# Patient Record
Sex: Female | Born: 1960 | Race: Black or African American | Hispanic: No | Smoking: Never smoker
Health system: Southern US, Community
[De-identification: ages and names within clinical notes are randomized; demographics above are authoritative.]

## PROBLEM LIST (undated history)

## (undated) DIAGNOSIS — E785 Hyperlipidemia, unspecified: Secondary | ICD-10-CM

## (undated) DIAGNOSIS — D500 Iron deficiency anemia secondary to blood loss (chronic): Secondary | ICD-10-CM

## (undated) DIAGNOSIS — E119 Type 2 diabetes mellitus without complications: Secondary | ICD-10-CM

## (undated) DIAGNOSIS — B019 Varicella without complication: Secondary | ICD-10-CM

## (undated) DIAGNOSIS — K635 Polyp of colon: Secondary | ICD-10-CM

## (undated) DIAGNOSIS — I10 Essential (primary) hypertension: Secondary | ICD-10-CM

## (undated) HISTORY — DX: Iron deficiency anemia secondary to blood loss (chronic): D50.0

## (undated) HISTORY — DX: Type 2 diabetes mellitus without complications: E11.9

## (undated) HISTORY — DX: Hyperlipidemia, unspecified: E78.5

## (undated) HISTORY — DX: Varicella without complication: B01.9

## (undated) HISTORY — PX: COLONOSCOPY: SHX174

## (undated) HISTORY — DX: Polyp of colon: K63.5

## (undated) HISTORY — DX: Essential (primary) hypertension: I10

---

## 2006-11-13 HISTORY — PX: BUNIONECTOMY: SHX129

## 2009-11-13 HISTORY — PX: ROTATOR CUFF REPAIR: SHX139

## 2012-04-17 DIAGNOSIS — M25559 Pain in unspecified hip: Secondary | ICD-10-CM | POA: Insufficient documentation

## 2013-07-17 DIAGNOSIS — E1122 Type 2 diabetes mellitus with diabetic chronic kidney disease: Secondary | ICD-10-CM | POA: Insufficient documentation

## 2017-05-12 DIAGNOSIS — M5432 Sciatica, left side: Secondary | ICD-10-CM | POA: Insufficient documentation

## 2017-05-12 DIAGNOSIS — Z6841 Body Mass Index (BMI) 40.0 and over, adult: Secondary | ICD-10-CM

## 2017-09-06 DIAGNOSIS — R0600 Dyspnea, unspecified: Secondary | ICD-10-CM | POA: Insufficient documentation

## 2017-09-06 DIAGNOSIS — R0609 Other forms of dyspnea: Principal | ICD-10-CM

## 2017-10-08 DIAGNOSIS — Z6841 Body Mass Index (BMI) 40.0 and over, adult: Secondary | ICD-10-CM

## 2017-10-08 DIAGNOSIS — N183 Chronic kidney disease, stage 3 unspecified: Secondary | ICD-10-CM | POA: Insufficient documentation

## 2017-10-08 DIAGNOSIS — R7989 Other specified abnormal findings of blood chemistry: Secondary | ICD-10-CM | POA: Insufficient documentation

## 2017-10-29 ENCOUNTER — Other Ambulatory Visit (HOSPITAL_BASED_OUTPATIENT_CLINIC_OR_DEPARTMENT_OTHER): Payer: Self-pay | Admitting: Family Medicine

## 2017-10-29 DIAGNOSIS — Z1231 Encounter for screening mammogram for malignant neoplasm of breast: Secondary | ICD-10-CM

## 2017-10-30 ENCOUNTER — Encounter (HOSPITAL_BASED_OUTPATIENT_CLINIC_OR_DEPARTMENT_OTHER): Payer: Self-pay | Admitting: Radiology

## 2017-10-30 ENCOUNTER — Ambulatory Visit (HOSPITAL_BASED_OUTPATIENT_CLINIC_OR_DEPARTMENT_OTHER)
Admission: RE | Admit: 2017-10-30 | Discharge: 2017-10-30 | Disposition: A | Discharge status: Home / Self Care | Payer: BLUE CROSS/BLUE SHIELD | Source: Ambulatory Visit | Attending: Family Medicine | Admitting: Family Medicine

## 2017-10-30 DIAGNOSIS — Z1231 Encounter for screening mammogram for malignant neoplasm of breast: Secondary | ICD-10-CM | POA: Insufficient documentation

## 2017-11-30 ENCOUNTER — Ambulatory Visit: Payer: Self-pay | Admitting: Family

## 2017-12-12 ENCOUNTER — Ambulatory Visit: Payer: Self-pay | Admitting: Obstetrics & Gynecology

## 2018-01-04 ENCOUNTER — Ambulatory Visit: Payer: BLUE CROSS/BLUE SHIELD | Admitting: Family

## 2018-01-04 ENCOUNTER — Encounter: Payer: Self-pay | Admitting: Family

## 2018-01-04 DIAGNOSIS — N289 Disorder of kidney and ureter, unspecified: Secondary | ICD-10-CM | POA: Diagnosis not present

## 2018-01-04 DIAGNOSIS — E118 Type 2 diabetes mellitus with unspecified complications: Secondary | ICD-10-CM

## 2018-01-04 DIAGNOSIS — I10 Essential (primary) hypertension: Secondary | ICD-10-CM | POA: Diagnosis not present

## 2018-01-04 DIAGNOSIS — E559 Vitamin D deficiency, unspecified: Secondary | ICD-10-CM | POA: Diagnosis not present

## 2018-01-04 DIAGNOSIS — E1165 Type 2 diabetes mellitus with hyperglycemia: Secondary | ICD-10-CM

## 2018-01-04 DIAGNOSIS — E785 Hyperlipidemia, unspecified: Secondary | ICD-10-CM | POA: Insufficient documentation

## 2018-01-04 NOTE — Patient Instructions (Signed)
Please complete lab work prior to leaving.  Welcome to [place]! 

## 2018-01-04 NOTE — Progress Notes (Signed)
Subjective:    Patient ID: Janet Rose, female    DOB: 03/26/1961, 57 y.o.   MRN: [id number]  HPI  Patient is a 57 yr old female who presents today to establish care.   DM2- reports that she was diagnosed with "pre-diabetes" 10 yrs ago. Diagnosed with DM2 5-6 years ago. She was previously followed at [facility]. She is working on her healthy diet and exercise.  She takes metformin bid. Reports good med compliance. Reports that aspirin caused her gi upset so she stopped. Also has stomach upset from statin.  She stopped.    Vit D deficiency- reports that she was recently diagnosed. Level was 11 back in January.   Reports that she saw nephrology due to "kidney test" and was told "to drink more water." Review in care everywhere and Cr was 1.6.    HTN- maintained on hyzaar 50-12.5.  BP Readings from Last 3 Encounters:  01/04/18 123/73   Hyperlipidemia- was on lipitor but stopped due to GI upset.     Review of Systems  Constitutional: Negative for unexpected weight change.  HENT: Negative for rhinorrhea.   Eyes: Negative for visual disturbance.  Respiratory: Negative for cough.   Cardiovascular: Negative for leg swelling.  Gastrointestinal: Negative for diarrhea, nausea and vomiting.  Genitourinary: Negative for dysuria and frequency.  Musculoskeletal: Negative for arthralgias and myalgias.  Skin: Negative for rash.  Neurological: Negative for headaches.  Hematological: Negative for adenopathy.  Psychiatric/Behavioral:       Denies depression/anxiety   Past Medical History:  Diagnosis Date  . Chicken pox   . Colon polyps   . Diabetes mellitus without complication (HCC)   . Hyperlipidemia   . Hypertension      Social History   Socioeconomic History  . Marital status: Divorced    Spouse name: Not on file  . Number of children: Not on file  . Years of education: Not on file  . Highest education level: Not on file  Social Needs  . Financial resource strain: Not on file   . Food insecurity - worry: Not on file  . Food insecurity - inability: Not on file  . Transportation needs - medical: Not on file  . Transportation needs - non-medical: Not on file  Occupational History  . Not on file  Tobacco Use  . Smoking status: Never Smoker  . Smokeless tobacco: Never Used  Substance and Sexual Activity  . Alcohol use: Yes    Frequency: Never    Comment: occasionally  . Drug use: No  . Sexual activity: Not on file  Other Topics Concern  . Not on file  Social History Narrative  . Not on file      Family History  Problem Relation Age of Onset  . Arthritis Mother   . Heart disease Mother   . Hyperlipidemia Mother   . Hypertension Mother   . Kidney disease Mother   . Stroke Mother   . Heart attack Mother   . Depression Sister   . Drug abuse Sister   . Early death Sister        ?accidental overdose  . Cancer Brother        prostate  . Diabetes Brother   . Asthma Son   . Learning disabilities Son   . Mental illness Son   . Asthma Sister   . Diabetes Sister   . Hypertension Sister   . Drug abuse Brother   . Diabetes Brother   . Heart attack  Brother   . Learning disabilities Brother   . Drug abuse Brother     Allergies  Allergen Reactions  . Lisinopril Other (See Comments)    Other reaction(s): COUGH Other reaction(s): COUGH     Current Outpatient Medications on File Prior to Visit  Medication Sig Dispense Refill  . losartan-hydrochlorothiazide (HYZAAR) 50-12.5 MG tablet Take by mouth.    . metFORMIN (GLUCOPHAGE) 500 MG tablet TK 2 TS PO BID  1  . Vitamin D, Ergocalciferol, (DRISDOL) 50000 units CAPS capsule TK 1 C PO 1 TIME A WK  5   No current facility-administered medications on file prior to visit.     BP 123/73 (BP Location: Right Arm, Patient Position: Sitting, Cuff Size: Large)   Pulse 86   Temp 98.3 F (36.8 C) (Oral)   Resp 16   Ht 4\' 11"  (1.499 m)   Wt 242 lb 6.4 oz (110 kg)   SpO2 100%   BMI 48.96 kg/m          Objective:   Physical Exam  Constitutional: She appears well-developed and well-nourished.  HENT:  Head: Normocephalic and atraumatic.  Eyes: No scleral icterus.  Cardiovascular: Normal rate, regular rhythm and normal heart sounds.  No murmur heard. Pulmonary/Chest: Effort normal and breath sounds normal. No respiratory distress. She has no wheezes.  Musculoskeletal: She exhibits no edema.  Lymphadenopathy:    She has no cervical adenopathy.  Neurological: She is alert.  Skin: Skin is warm and dry.  Psychiatric: She has a normal mood and affect. Her behavior is normal. Judgment and thought content normal.          Assessment & Plan:

## 2018-01-05 LAB — BASIC METABOLIC PANEL
BUN/Creatinine Ratio: 18 (calc) (ref 6–22)
BUN: 27 mg/dL — AB (ref 7–25)
CALCIUM: 9.5 mg/dL (ref 8.6–10.4)
CO2: 26 mmol/L (ref 20–32)
CREATININE: 1.52 mg/dL — AB (ref 0.50–1.05)
Chloride: 105 mmol/L (ref 98–110)
GLUCOSE: 147 mg/dL — AB (ref 65–99)
Potassium: 4.4 mmol/L (ref 3.5–5.3)
Sodium: 140 mmol/L (ref 135–146)

## 2018-01-05 LAB — HEPATIC FUNCTION PANEL
AG RATIO: 1.1 (calc) (ref 1.0–2.5)
ALT: 13 U/L (ref 6–29)
AST: 16 U/L (ref 10–35)
Albumin: 3.9 g/dL (ref 3.6–5.1)
Alkaline phosphatase (APISO): 86 U/L (ref 33–130)
BILIRUBIN DIRECT: 0.1 mg/dL (ref 0.0–0.2)
BILIRUBIN TOTAL: 0.3 mg/dL (ref 0.2–1.2)
Globulin: 3.6 g/dL (calc) (ref 1.9–3.7)
Indirect Bilirubin: 0.2 mg/dL (calc) (ref 0.2–1.2)
Total Protein: 7.5 g/dL (ref 6.1–8.1)

## 2018-01-05 LAB — LIPID PANEL
CHOL/HDL RATIO: 3.7 (calc) (ref <5.0)
CHOLESTEROL: 125 mg/dL (ref <200)
HDL: 34 mg/dL — AB (ref >50)
LDL Cholesterol (Calc): 73 mg/dL (calc)
Non-HDL Cholesterol (Calc): 91 mg/dL (calc) (ref <130)
TRIGLYCERIDES: 95 mg/dL (ref <150)

## 2018-01-05 LAB — HEMOGLOBIN A1C
EAG (MMOL/L): 11.7 (calc)
Hgb A1c MFr Bld: 9 % of total Hgb — ABNORMAL HIGH (ref <5.7)
Mean Plasma Glucose: 212 (calc)

## 2018-01-06 ENCOUNTER — Other Ambulatory Visit: Payer: Self-pay | Admitting: Family

## 2018-01-06 NOTE — Telephone Encounter (Signed)
Sugar is above goal. Add januvia once daily.  Please ask her what pharmacy she uses.  Also, cholesterol looks ok but we really should have her on statin to protect her heart.  I will send an rx for a different statin. Please start and call me if she has GI upset .

## 2018-01-07 DIAGNOSIS — N289 Disorder of kidney and ureter, unspecified: Secondary | ICD-10-CM | POA: Insufficient documentation

## 2018-01-07 DIAGNOSIS — IMO0002 Reserved for concepts with insufficient information to code with codable children: Secondary | ICD-10-CM | POA: Insufficient documentation

## 2018-01-07 DIAGNOSIS — E559 Vitamin D deficiency, unspecified: Secondary | ICD-10-CM | POA: Insufficient documentation

## 2018-01-07 DIAGNOSIS — E1165 Type 2 diabetes mellitus with hyperglycemia: Secondary | ICD-10-CM | POA: Insufficient documentation

## 2018-01-07 NOTE — Assessment & Plan Note (Addendum)
Lab Results  Component Value Date   HGBA1C 9.0 (H) 01/04/2018   Add januvia 50mg  (see phone note).  Glipizide 2.5 mg and d/c metformin.

## 2018-01-07 NOTE — Assessment & Plan Note (Signed)
BP is stable on current meds, continue same.  

## 2018-01-07 NOTE — Assessment & Plan Note (Signed)
Lab Results  Component Value Date   CREATININE 1.52 (H) 01/04/2018   Will d/c metformin, see phone note.

## 2018-01-07 NOTE — Assessment & Plan Note (Signed)
Continue vit D supplement, plan to check level at next visit.

## 2018-01-07 NOTE — Telephone Encounter (Signed)
Also, her kidney function is mildly reduced. She should stop metformin.  Add glipizide 2.5mg  once daily along with [place] 50mg .  Work on diet.

## 2018-01-07 NOTE — Assessment & Plan Note (Addendum)
Lab Results  Component Value Date   CHOL 125 01/04/2018   HDL 34 (L) 01/04/2018   TRIG 95 01/04/2018   CHOLHDL 3.7 01/04/2018   Will give trial of low dose simvastatin to see if she can better tolerate from GI standpoint and to help reduce CV risk.

## 2018-01-08 LAB — MICROALBUMIN / CREATININE URINE RATIO
CREATININE, URINE: 85 mg/dL (ref 20–275)
MICROALB/CREAT RATIO: 4 ug/mg{creat} (ref <30)
Microalb, Ur: 0.3 mg/dL

## 2018-01-08 MED ORDER — GLIPIZIDE ER 2.5 MG PO TB24
2.5000 mg | ORAL_TABLET | Freq: Every day | ORAL | 5 refills | Status: AC
Start: 2018-01-08 — End: ?

## 2018-01-08 MED ORDER — SIMVASTATIN 20 MG PO TABS: 20.0000 mg | ORAL_TABLET | Freq: Every day | ORAL | 3 refills | Status: DC

## 2018-01-08 MED ORDER — SITAGLIPTIN PHOSPHATE 50 MG PO TABS: 50.0000 mg | ORAL_TABLET | Freq: Every day | ORAL | 3 refills | Status: DC

## 2018-01-08 MED ORDER — FREESTYLE LANCETS MISC: 1 refills | Status: DC

## 2018-01-08 NOTE — Telephone Encounter (Signed)
Janet Rose-- I attempted to send glipizide 2.5mg  but it was only available in that strength in the ER / XL form. Is that ok?  Notified pt and she voices understanding. She is agreeable to try the statin. Rxs sent. She also states that pharmacy is needing an Rx for generic lancets. She took handwritten Rx to pharmacy and they have not given her a meter yet but she thinks it may be a freestyle. Rx sent.

## 2018-02-19 ENCOUNTER — Other Ambulatory Visit: Payer: Self-pay | Admitting: Family

## 2018-04-05 ENCOUNTER — Telehealth: Payer: Self-pay | Admitting: Family

## 2018-04-05 ENCOUNTER — Ambulatory Visit (INDEPENDENT_AMBULATORY_CARE_PROVIDER_SITE_OTHER): Payer: BLUE CROSS/BLUE SHIELD | Admitting: Family

## 2018-04-05 ENCOUNTER — Encounter: Payer: Self-pay | Admitting: Family

## 2018-04-05 VITALS — BP 108/50 | HR 88 | Temp 98.5°F | Resp 16 | Ht 59.5 in | Wt 241.4 lb

## 2018-04-05 DIAGNOSIS — Z0000 Encounter for general adult medical examination without abnormal findings: Secondary | ICD-10-CM | POA: Diagnosis not present

## 2018-04-05 DIAGNOSIS — E785 Hyperlipidemia, unspecified: Secondary | ICD-10-CM | POA: Diagnosis not present

## 2018-04-05 DIAGNOSIS — E1165 Type 2 diabetes mellitus with hyperglycemia: Secondary | ICD-10-CM

## 2018-04-05 DIAGNOSIS — D649 Anemia, unspecified: Secondary | ICD-10-CM

## 2018-04-05 DIAGNOSIS — E559 Vitamin D deficiency, unspecified: Secondary | ICD-10-CM | POA: Diagnosis not present

## 2018-04-05 DIAGNOSIS — R9431 Abnormal electrocardiogram [ECG] [EKG]: Secondary | ICD-10-CM

## 2018-04-05 DIAGNOSIS — I10 Essential (primary) hypertension: Secondary | ICD-10-CM | POA: Diagnosis not present

## 2018-04-05 NOTE — Telephone Encounter (Signed)
Please let pt know that I reviewed her EKG. It is mildly abnormal. I would like her to meet with cardiology for further evaluation. Go to the ER in the event of chest pain.

## 2018-04-05 NOTE — Progress Notes (Signed)
Subjective:    Patient ID: Janet Rose, female    DOB: 03/01/1961, 57 y.o.   MRN: [id number]  HPI  Patient presents today for complete physical.  Immunizations: Tetanus performed 12/23/2010, she is due for a follow-up Pneumovax booster today. Diet: reports that she is following a vegetarian diet Exercise: zumba 2 times a week Colonoscopy: Last colonoscopy performed 08/13/2017 Wt Readings from Last 3 Encounters:  04/05/18 241 lb 6.4 oz (109.5 kg)  01/04/18 242 lb 6.4 oz (110 kg)  Dexa: due Pap Smear:due sees GYN Mammogram: Performed 10/30/2017 Vision:  Due- will schedule Dental:  Due, she is getting a new dental insurance   Diabetes type 2- she is maintained on Glucotrol and [place].  Not checking sugars every day.  Reports that she sees sugars high of 190. Denies hypoglycemia.  Lab Results  Component Value Date   HGBA1C 9.0 (H) 01/04/2018   Lab Results  Component Value Date   MICROALBUR 0.3 01/04/2018   LDLCALC 73 01/04/2018   CREATININE 1.52 (H) 01/04/2018   Chronic kidney disease stage III-  Followed by Dr. Janit Pagan, nephrology.   Hyperlipidemia- Last visit we gave her a trial of simvastatin. Denies side effect.  Lab Results  Component Value Date   CHOL 125 01/04/2018   HDL 34 (L) 01/04/2018   LDLCALC 73 01/04/2018   TRIG 95 01/04/2018   CHOLHDL 3.7 01/04/2018   Vit D deficien she is maintained on vitamin D. Review of Systems  Constitutional: Negative for unexpected weight change.  HENT: Negative for hearing loss and rhinorrhea.   Eyes: Negative for visual disturbance.  Respiratory:       Mild allergy cough  Cardiovascular: Negative for leg swelling.  Gastrointestinal: Negative for constipation and diarrhea.  Genitourinary: Negative for dysuria and frequency.  Musculoskeletal: Negative for arthralgias and myalgias.  Neurological: Negative for headaches.  Hematological: Negative for adenopathy.  Psychiatric/Behavioral:       Denies depression/anxiety   Past  Medical History:  Diagnosis Date  . Chicken pox   . Colon polyps   . Diabetes mellitus without complication (HCC)   . Hyperlipidemia   . Hypertension      Social History   Socioeconomic History  . Marital status: Divorced    Spouse name: Not on file  . Number of children: Not on file  . Years of education: Not on file  . Highest education level: Not on file  Occupational History  . Not on file  Social Needs  . Financial resource strain: Not on file  . Food insecurity:    Worry: Not on file    Inability: Not on file  . Transportation needs:    Medical: Not on file    Non-medical: Not on file  Tobacco Use  . Smoking status: Never Smoker  . Smokeless tobacco: Never Used  Substance and Sexual Activity  . Alcohol use: Yes    Frequency: Never    Comment: occasionally  . Drug use: No  . Sexual activity: Not on file  Lifestyle  . Physical activity:    Days per week: Not on file    Minutes per session: Not on file  . Stress: Not on file  Relationships  . Social connections:    Talks on phone: Not on file    Gets together: Not on file    Attends religious service: Not on file    Active member of club or organization: Not on file    Attends meetings of clubs or organizations: Not  on file    Relationship status: Not on file  . Intimate partner violence:    Fear of current or ex partner: Not on file    Emotionally abused: Not on file    Physically abused: Not on file    Forced sexual activity: Not on file  Other Topics Concern  . Not on file  Social History Narrative  . Not on file      Family History  Problem Relation Age of Onset  . Arthritis Mother   . Heart disease Mother   . Hyperlipidemia Mother   . Hypertension Mother   . Kidney disease Mother   . Stroke Mother   . Heart attack Mother   . Depression Sister   . Drug abuse Sister   . Early death Sister        ?accidental overdose  . Cancer Brother        prostate  . Diabetes Brother   . Asthma Son    . Learning disabilities Son   . Mental illness Son   . Asthma Sister   . Diabetes Sister   . Hypertension Sister   . Drug abuse Brother   . Diabetes Brother   . Heart attack Brother   . Learning disabilities Brother   . Drug abuse Brother     Allergies  Allergen Reactions  . Lisinopril Other (See Comments)    Other reaction(s): COUGH Other reaction(s): COUGH     Current Outpatient Medications on File Prior to Visit  Medication Sig Dispense Refill  . glipiZIDE (GLUCOTROL XL) 2.5 MG 24 hr tablet Take 1 tablet (2.5 mg total) by mouth daily with breakfast. 30 tablet 5  . Lancets (FREESTYLE) lancets Use as instructed to check blood sugar once a day.  DX E11.65 100 each 1  . losartan-hydrochlorothiazide (HYZAAR) 50-12.5 MG tablet TAKE 1 TABLET BY MOUTH DAILY 90 tablet 0  . simvastatin (ZOCOR) 20 MG tablet Take 1 tablet (20 mg total) by mouth at bedtime. 30 tablet 3  . sitaGLIPtin (JANUVIA) 50 MG tablet Take 1 tablet (50 mg total) by mouth daily. 30 tablet 3  . Vitamin D, Ergocalciferol, (DRISDOL) 50000 units CAPS capsule TK 1 C PO 1 TIME A WK  5   No current facility-administered medications on file prior to visit.     BP (!) 108/50 (BP Location: Right Arm, Patient Position: Sitting, Cuff Size: Large)   Pulse 88   Temp 98.5 F (36.9 C) (Oral)   Resp 16   Ht 4' 11.5" (1.511 m)   Wt 241 lb 6.4 oz (109.5 kg)   SpO2 100%   BMI 47.94 kg/m       Objective:   Physical Exam Physical Exam  Constitutional: She is oriented to person, place, and time. She appears well-developed and well-nourished. No distress.  HENT:  Head: Normocephalic and atraumatic.  Right Ear: Tympanic membrane and ear canal normal.  Left Ear: Tympanic membrane and ear canal normal.  Mouth/Throat: Oropharynx is clear and moist.  Eyes: Pupils are equal, round, and reactive to light. No scleral icterus.  Neck: Normal range of motion. No thyromegaly present.  Cardiovascular: Normal rate and regular rhythm.    No murmur heard. Pulmonary/Chest: Effort normal and breath sounds normal. No respiratory distress. He has no wheezes. She has no rales. She exhibits no tenderness.  Abdominal: Soft. Bowel sounds are normal. She exhibits no distension and no mass. There is no tenderness. There is no rebound and no guarding.  Musculoskeletal: She  exhibits no edema.  Lymphadenopathy:    She has no cervical adenopathy.  Neurological: She is alert and oriented to person, place, and time. She has normal patellar reflexes. She exhibits normal muscle tone. Coordination normal.  Skin: Skin is warm and dry.  Psychiatric: She has a normal mood and affect. Her behavior is normal. Judgment and thought content normal.  Breast/pelvic: deferred       Assessment & Plan:   Preventative- discussed diet/exercise/weight loss.  Obtain routine lab work. mammo up to date, declines pneumovax booster.  DM2- check follow up A1C.  Hyperlipidemia- check follow up lipid panel.   Vit D deficiency- check follow up vit D.  HTN- bp stable on current meds.   Abnormal EKG- EKG is personally reviewed. No EKG available for comparison. She does have some inverted T waves in leads I, III and V1. Will refer to cardiology for further evaluation.     Assessment & Plan:

## 2018-04-05 NOTE — Patient Instructions (Addendum)
Please complete lab work prior to leaving. Continue to work on [employer], exercise, weight loss.  Schedule a bone density on the first floor.

## 2018-04-06 ENCOUNTER — Telehealth: Payer: Self-pay | Admitting: Family

## 2018-04-06 MED ORDER — PIOGLITAZONE HCL 30 MG PO TABS: 30.0000 mg | ORAL_TABLET | Freq: Every day | ORAL | 2 refills | Status: DC

## 2018-04-06 MED ORDER — SIMVASTATIN 40 MG PO TABS: 40.0000 mg | ORAL_TABLET | Freq: Every day | ORAL | 5 refills | Status: DC

## 2018-04-06 NOTE — Telephone Encounter (Signed)
Also, cholesterol remains above goal. Increase simvastatin from  to  once daily.  Blood count is low.  Ask lab to add on serum iron, ferritin, b12 and folate.  Complete IFOB, dx anemia.  A1C control has worsened. Add actos.  Work on diet/exercise/weight loss. Check sugar once daily for 1 week after starting actos and call readings in to me. Follow up in 3 months.

## 2018-04-09 ENCOUNTER — Other Ambulatory Visit (HOSPITAL_BASED_OUTPATIENT_CLINIC_OR_DEPARTMENT_OTHER): Payer: BLUE CROSS/BLUE SHIELD

## 2018-04-09 LAB — IRON,TIBC AND FERRITIN PANEL
%SAT: 15 % (ref 11–50)
FERRITIN: 63 ng/mL (ref 10–232)
IRON: 50 ug/dL (ref 45–160)
TIBC: 325 mcg/dL (calc) (ref 250–450)

## 2018-04-09 LAB — BASIC METABOLIC PANEL
BUN/Creatinine Ratio: 16 (calc) (ref 6–22)
BUN: 27 mg/dL — ABNORMAL HIGH (ref 7–25)
CALCIUM: 9.6 mg/dL (ref 8.6–10.4)
CO2: 24 mmol/L (ref 20–32)
Chloride: 102 mmol/L (ref 98–110)
Creat: 1.67 mg/dL — ABNORMAL HIGH (ref 0.50–1.05)
GLUCOSE: 219 mg/dL — AB (ref 65–99)
Potassium: 4.4 mmol/L (ref 3.5–5.3)
Sodium: 136 mmol/L (ref 135–146)

## 2018-04-09 LAB — TEST AUTHORIZATION

## 2018-04-09 LAB — LIPID PANEL
Cholesterol: 192 mg/dL (ref <200)
HDL: 35 mg/dL — ABNORMAL LOW (ref >50)
LDL CHOLESTEROL (CALC): 136 mg/dL — AB
NON-HDL CHOLESTEROL (CALC): 157 mg/dL — AB (ref <130)
Total CHOL/HDL Ratio: 5.5 (calc) — ABNORMAL HIGH (ref <5.0)
Triglycerides: 106 mg/dL (ref <150)

## 2018-04-09 LAB — URINALYSIS, ROUTINE W REFLEX MICROSCOPIC
Bilirubin Urine: NEGATIVE
Glucose, UA: NEGATIVE
Hgb urine dipstick: NEGATIVE
Ketones, ur: NEGATIVE
LEUKOCYTES UA: NEGATIVE
Nitrite: NEGATIVE
PROTEIN: NEGATIVE
SPECIFIC GRAVITY, URINE: 1.014 (ref 1.001–1.03)
pH: <=5 (ref 5.0–8.0)

## 2018-04-09 LAB — CBC WITH DIFFERENTIAL/PLATELET
BASOS ABS: 32 {cells}/uL (ref 0–200)
Basophils Relative: 0.4 %
EOS ABS: 168 {cells}/uL (ref 15–500)
Eosinophils Relative: 2.1 %
HCT: 29.6 % — ABNORMAL LOW (ref 35.0–45.0)
HEMOGLOBIN: 10 g/dL — AB (ref 11.7–15.5)
Lymphs Abs: 3608 cells/uL (ref 850–3900)
MCH: 27.9 pg (ref 27.0–33.0)
MCHC: 33.8 g/dL (ref 32.0–36.0)
MCV: 82.7 fL (ref 80.0–100.0)
MONOS PCT: 6.3 %
MPV: 11.6 fL (ref 7.5–12.5)
NEUTROS PCT: 46.1 %
Neutro Abs: 3688 cells/uL (ref 1500–7800)
PLATELETS: 268 10*3/uL (ref 140–400)
RBC: 3.58 10*6/uL — ABNORMAL LOW (ref 3.80–5.10)
RDW: 12.5 % (ref 11.0–15.0)
TOTAL LYMPHOCYTE: 45.1 %
WBC mixed population: 504 cells/uL (ref 200–950)
WBC: 8 10*3/uL (ref 3.8–10.8)

## 2018-04-09 LAB — TSH: TSH: 0.92 m[IU]/L (ref 0.40–4.50)

## 2018-04-09 LAB — VITAMIN D 1,25 DIHYDROXY
VITAMIN D 1, 25 (OH) TOTAL: 41 pg/mL (ref 18–72)
VITAMIN D2 1, 25 (OH): 29 pg/mL
VITAMIN D3 1, 25 (OH): 12 pg/mL

## 2018-04-09 LAB — HEPATIC FUNCTION PANEL
AG RATIO: 1.2 (calc) (ref 1.0–2.5)
ALKALINE PHOSPHATASE (APISO): 78 U/L (ref 33–130)
ALT: 14 U/L (ref 6–29)
AST: 20 U/L (ref 10–35)
Albumin: 4 g/dL (ref 3.6–5.1)
BILIRUBIN INDIRECT: 0.4 mg/dL (ref 0.2–1.2)
BILIRUBIN TOTAL: 0.5 mg/dL (ref 0.2–1.2)
Bilirubin, Direct: 0.1 mg/dL (ref 0.0–0.2)
Globulin: 3.4 g/dL (calc) (ref 1.9–3.7)
Total Protein: 7.4 g/dL (ref 6.1–8.1)

## 2018-04-09 LAB — HEMOGLOBIN A1C
HEMOGLOBIN A1C: 10.9 %{Hb} — AB (ref <5.7)
Mean Plasma Glucose: 266 (calc)
eAG (mmol/L): 14.7 (calc)

## 2018-04-09 LAB — B12 AND FOLATE PANEL
Folate: 11.8 ng/mL
Vitamin B-12: 510 pg/mL (ref 200–1100)

## 2018-04-09 NOTE — Telephone Encounter (Signed)
Returning call.

## 2018-04-09 NOTE — Telephone Encounter (Signed)
Notified pt of below results. She will pick up IFOB tonight. Kit placed at front desk and order entered. Below labs were added on per [facility] this morning. 3 month follow up scheduled for 07/10/18 at 12:40pm.

## 2018-04-09 NOTE — Telephone Encounter (Signed)
Labs added per [place] at [facility]. Left message for pt to return my call. Please notify pt of below when she returns call and ask her if she wants IFOB kit mailed to her or does she want to pick it up at the office?

## 2018-04-10 NOTE — Telephone Encounter (Signed)
Opened in error

## 2018-04-16 ENCOUNTER — Ambulatory Visit: Payer: BLUE CROSS/BLUE SHIELD | Admitting: Cardiology

## 2018-04-16 ENCOUNTER — Encounter: Payer: Self-pay | Admitting: Cardiology

## 2018-04-16 VITALS — BP 132/78 | HR 86 | Ht 59.5 in | Wt 243.0 lb

## 2018-04-16 DIAGNOSIS — I10 Essential (primary) hypertension: Secondary | ICD-10-CM

## 2018-04-16 DIAGNOSIS — N289 Disorder of kidney and ureter, unspecified: Secondary | ICD-10-CM | POA: Diagnosis not present

## 2018-04-16 DIAGNOSIS — N183 Chronic kidney disease, stage 3 unspecified: Secondary | ICD-10-CM

## 2018-04-16 DIAGNOSIS — R0609 Other forms of dyspnea: Secondary | ICD-10-CM | POA: Diagnosis not present

## 2018-04-16 DIAGNOSIS — R0602 Shortness of breath: Secondary | ICD-10-CM | POA: Diagnosis not present

## 2018-04-16 DIAGNOSIS — Z6841 Body Mass Index (BMI) 40.0 and over, adult: Secondary | ICD-10-CM | POA: Diagnosis not present

## 2018-04-16 DIAGNOSIS — E782 Mixed hyperlipidemia: Secondary | ICD-10-CM | POA: Diagnosis not present

## 2018-04-16 DIAGNOSIS — R0600 Dyspnea, unspecified: Secondary | ICD-10-CM

## 2018-04-16 DIAGNOSIS — E66813 Obesity, class 3: Secondary | ICD-10-CM

## 2018-04-16 MED ORDER — ASPIRIN EC 81 MG PO TBEC: 81.0000 mg | DELAYED_RELEASE_TABLET | Freq: Every day | ORAL | 3 refills | Status: DC

## 2018-04-16 NOTE — Progress Notes (Signed)
Cardiology Office Note:    Date:  04/16/2018   ID:  Janet Rose, DOB July 11, 1961, MRN [id number]  PCP:  Sandford Craze, NP  Cardiologist:  Garwin Brothers, MD   Referring MD: Sandford Craze, NP    ASSESSMENT:    1. DOE (dyspnea on exertion)   2. Essential hypertension   3. Renal insufficiency   4. CKD (chronic kidney disease) stage 3, GFR 30-59 ml/min (HCC)   5. Mixed hyperlipidemia   6. Class 3 severe obesity due to excess calories with serious comorbidity and body mass index (BMI) of 45.0 to 49.9 in adult (HCC)   7. Morbid obesity with BMI of 45.0-49.9, adult (HCC)   8. Shortness of breath    PLAN:    In order of problems listed above:  1. Primary prevention stressed to the patient.  Importance of compliance with diet and medications stressed and she vocalized understanding.  Her blood pressure is stable.  Diet was discussed for dyslipidemia diabetes mellitus and obesity and the risks of obesity explained and she vocalized understanding. 2. Echocardiogram will be done to assess murmur heard on auscultation.  I would like to assess her shortness of breath symptoms and make sure that this does not have a coronary etiology.  She will undergo exercise stress Cardiolite. 3. I told her to take aspirin if it is okay with her primary care physician.  She is being evaluated for anemia.  I recommended aspirin if there are no contraindications in view of her diabetes mellitus. 4. Patient will be seen in follow-up appointment in 3 months or earlier if the patient has any concerns    Medication Adjustments/Labs and Tests Ordered: Current medicines are reviewed at length with the patient today.  Concerns regarding medicines are outlined above.  Orders Placed This Encounter  Procedures  . MYOCARDIAL PERFUSION IMAGING  . ECHOCARDIOGRAM COMPLETE   Meds ordered this encounter  Medications  . aspirin EC 81 MG tablet    Sig: Take 1 tablet (81 mg total) by mouth daily.    Dispense:   90 tablet    Refill:  3     History of Present Illness:    Janet Rose is a 57 y.o. female who is being seen today for the evaluation of shortness of breath on exertion at the request of Sandford Craze, NP.  Patient is a pleasant 57 year old female.  She has past medical history of essential hypertension, renal insufficiency, dyslipidemia and morbid obesity.  She is referred here for shortness of breath on exertion.  She mentions to me that she has no chest pain orthopnea or PND and when she starts exerting herself she gets out of breath.  This is been happening for the past several weeks.  No syncope.  She is active lady but does not exercise on a regular basis.  At the time of my evaluation, the patient is alert awake oriented and in no distress.  Past Medical History:  Diagnosis Date  . Chicken pox   . Colon polyps   . Diabetes mellitus without complication (HCC)   . Hyperlipidemia   . Hypertension     Past Surgical History:  Procedure Laterality Date  . CESAREAN SECTION  1991  . CESAREAN SECTION  1994    Current Medications: Current Meds  Medication Sig  . glipiZIDE (GLUCOTROL XL) 2.5 MG 24 hr tablet Take 1 tablet (2.5 mg total) by mouth daily with breakfast.  . Lancets (FREESTYLE) lancets Use as instructed to check blood sugar  once a day.  DX E11.65  . losartan-hydrochlorothiazide (HYZAAR) 50-12.5 MG tablet TAKE 1 TABLET BY MOUTH DAILY  . simvastatin (ZOCOR) 40 MG tablet Take 1 tablet (40 mg total) by mouth at bedtime.  . sitaGLIPtin (JANUVIA) 50 MG tablet Take 1 tablet (50 mg total) by mouth daily.  . Vitamin D, Ergocalciferol, (DRISDOL) 50000 units CAPS capsule TK 1 C PO 1 TIME A WK     Allergies:   Lisinopril   Social History   Socioeconomic History  . Marital status: Divorced    Spouse name: Not on file  . Number of children: Not on file  . Years of education: Not on file  . Highest education level: Not on file  Occupational History  . Not on file    Social Needs  . Financial resource strain: Not on file  . Food insecurity:    Worry: Not on file    Inability: Not on file  . Transportation needs:    Medical: Not on file    Non-medical: Not on file  Tobacco Use  . Smoking status: Never Smoker  . Smokeless tobacco: Never Used  Substance and Sexual Activity  . Alcohol use: Yes    Frequency: Never    Comment: occasionally  . Drug use: No  . Sexual activity: Not on file  Lifestyle  . Physical activity:    Days per week: Not on file    Minutes per session: Not on file  . Stress: Not on file  Relationships  . Social connections:    Talks on phone: Not on file    Gets together: Not on file    Attends religious service: Not on file    Active member of club or organization: Not on file    Attends meetings of clubs or organizations: Not on file    Relationship status: Not on file  Other Topics Concern  . Not on file  Social History Narrative   Lives with daughter   Works as an Secretary/administrator (makes cakes)   No pets   1 [place]     Family History: The patient's family history includes Arthritis in her mother; Asthma in her sister and son; Cancer in her brother; Depression in her sister; Diabetes in her brother, brother, and sister; Drug abuse in her brother, brother, and sister; Early death in her sister; Heart attack in her brother and mother; Heart disease in her mother; Hyperlipidemia in her mother; Hypertension in her mother and sister; Kidney disease in her mother; Learning disabilities in her brother and son; Mental illness in her son; Stroke in her mother.  ROS:   Please see the history of present illness.    All other systems reviewed and are negative.  EKGs/Labs/Other Studies Reviewed:    The following studies were reviewed today: I discussed my findings with the patient at extensive length.  I reviewed records her hemoglobin A1c is markedly elevated.   Recent Labs: 04/05/2018: ALT 14;  BUN 27; Creat 1.67; Hemoglobin 10.0; Platelets 268; Potassium 4.4; Sodium 136; TSH 0.92  Recent Lipid Panel    Component Value Date/Time   CHOL 192 04/05/2018 1510   TRIG 106 04/05/2018 1510   HDL 35 (L) 04/05/2018 1510   CHOLHDL 5.5 (H) 04/05/2018 1510   LDLCALC 136 (H) 04/05/2018 1510    Physical Exam:    VS:  BP 132/78 (BP Location: Left Arm, Patient Position: Sitting, Cuff Size: Normal)   Pulse 86  Ht 4' 11.5" (1.511 m)   Wt 243 lb (110.2 kg)   SpO2 98%   BMI 48.26 kg/m     Wt Readings from Last 3 Encounters:  04/16/18 243 lb (110.2 kg)  04/05/18 241 lb 6.4 oz (109.5 kg)  01/04/18 242 lb 6.4 oz (110 kg)     GEN: Patient is in no acute distress HEENT: Normal NECK: No JVD; No carotid bruits LYMPHATICS: No lymphadenopathy CARDIAC: S1 S2 regular, 2/6 systolic murmur at the apex. RESPIRATORY:  Clear to auscultation without rales, wheezing or rhonchi  ABDOMEN: Soft, non-tender, non-distended MUSCULOSKELETAL:  No edema; No deformity  SKIN: Warm and dry NEUROLOGIC:  Alert and oriented x 3 PSYCHIATRIC:  Normal affect    Signed, Garwin Brothersajan R Chriss Mannan, MD  04/16/2018 9:13 AM    [facility]

## 2018-04-16 NOTE — Patient Instructions (Addendum)
Medication Instructions:  Your physician has recommended you make the following change in your medication:  START 81 mg of enteric coated aspirin daily  Labwork: None  Testing/Procedures: Your physician has requested that you have an echocardiogram. Echocardiography is a painless test that uses sound waves to create images of your heart. It provides your doctor with information about the size and shape of your heart and how well your heart's chambers and valves are working. This procedure takes approximately one hour. There are no restrictions for this procedure.   Your physician has requested that you have en exercise stress myoview. For further information please visit [email or web address]. Please follow instruction sheet, as given. 2 day test.  Follow-Up: Your physician recommends that you schedule a follow-up appointment in: 3 months  Any Other Special Instructions Will Be Listed Below (If Applicable).     If you need a refill on your cardiac medications before your next appointment, please call your pharmacy.   [facility]  Garey HamAshley A, RN, BSN   Echocardiogram An echocardiogram, or echocardiography, uses sound waves (ultrasound) to produce an image of your heart. The echocardiogram is simple, painless, obtained within a short period of time, and offers valuable information to your health care provider. The images from an echocardiogram can provide information such as:  Evidence of coronary artery disease (CAD).  Heart size.  Heart muscle function.  Heart valve function.  Aneurysm detection.  Evidence of a past heart attack.  Fluid buildup around the heart.  Heart muscle thickening.  Assess heart valve function.  Tell a health care provider about:  Any allergies you have.  All medicines you are taking, including vitamins, herbs, eye drops, creams, and over-the-counter medicines.  Any problems you or family members have had with anesthetic medicines.  Any  blood disorders you have.  Any surgeries you have had.  Any medical conditions you have.  Whether you are pregnant or may be pregnant. What happens before the procedure? No special preparation is needed. Eat and drink normally. What happens during the procedure?  In order to produce an image of your heart, gel will be applied to your chest and a wand-like tool (transducer) will be moved over your chest. The gel will help transmit the sound waves from the transducer. The sound waves will harmlessly bounce off your heart to allow the heart images to be captured in real-time motion. These images will then be recorded.  You may need an IV to receive a medicine that improves the quality of the pictures. What happens after the procedure? You may return to your normal schedule including diet, activities, and medicines, unless your health care provider tells you otherwise. This information is not intended to replace advice given to you by your health care provider. Make sure you discuss any questions you have with your health care provider. Document Released: 10/27/2000 Document Revised: 06/17/2016 Document Reviewed: 07/07/2013 Elsevier Interactive Patient Education  2017 [employer]

## 2018-04-22 ENCOUNTER — Telehealth (HOSPITAL_COMMUNITY): Payer: Self-pay | Admitting: *Deleted

## 2018-04-22 NOTE — Telephone Encounter (Signed)
Left message on voicemail per DPR in reference to upcoming appointment scheduled on 04/24/18  with detailed instructions given per Myocardial Perfusion Study Information Sheet for the test. LM to arrive 15 minutes early, and that it is imperative to arrive on time for appointment to keep from having the test rescheduled. If you need to cancel or reschedule your appointment, please call the office within 24 hours of your appointment. Failure to do so may result in a cancellation of your appointment, and a $50 no show fee. Phone number given for call back for any questions. mitral regurgitation

## 2018-04-23 ENCOUNTER — Other Ambulatory Visit (INDEPENDENT_AMBULATORY_CARE_PROVIDER_SITE_OTHER): Payer: BLUE CROSS/BLUE SHIELD

## 2018-04-23 ENCOUNTER — Telehealth: Payer: Self-pay | Admitting: Family

## 2018-04-23 ENCOUNTER — Encounter: Payer: Self-pay | Admitting: Gastroenterology

## 2018-04-23 ENCOUNTER — Other Ambulatory Visit: Payer: Self-pay | Admitting: Emergency Medicine

## 2018-04-23 DIAGNOSIS — D649 Anemia, unspecified: Secondary | ICD-10-CM

## 2018-04-23 DIAGNOSIS — R195 Other fecal abnormalities: Secondary | ICD-10-CM

## 2018-04-23 LAB — FECAL OCCULT BLOOD, IMMUNOCHEMICAL: Fecal Occult Bld: POSITIVE — AB

## 2018-04-23 NOTE — Telephone Encounter (Signed)
Lab work shows + blood in stool.  This is likely contributing to her anemia. I would like to refer her to GI.

## 2018-04-23 NOTE — Telephone Encounter (Signed)
Left detailed message on pt's voicemail and to let us know if she has not been contacted about referral appt within 1 week.

## 2018-04-24 ENCOUNTER — Ambulatory Visit (HOSPITAL_COMMUNITY): Payer: BLUE CROSS/BLUE SHIELD | Attending: Cardiology

## 2018-04-24 VITALS — Ht 59.5 in | Wt 243.0 lb

## 2018-04-24 DIAGNOSIS — R0602 Shortness of breath: Secondary | ICD-10-CM | POA: Diagnosis not present

## 2018-04-24 DIAGNOSIS — R0609 Other forms of dyspnea: Secondary | ICD-10-CM

## 2018-04-24 DIAGNOSIS — I10 Essential (primary) hypertension: Secondary | ICD-10-CM | POA: Diagnosis present

## 2018-04-24 DIAGNOSIS — R0600 Dyspnea, unspecified: Secondary | ICD-10-CM

## 2018-04-24 IMAGING — NM NM MISC PROCEDURE
5 series · 30 of 30 positions shown · non-contrast
Comparison: none

[Series 1: stress - gated · 6.51mm/px · 6 of 512 frames shown]
[frame 43/512]
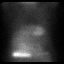
[frame 128/512]
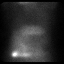
[frame 214/512]
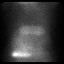
[frame 299/512]
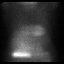
[frame 384/512]
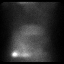
[frame 470/512]
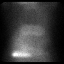

[Series 1: wbr_s-proj_st stress - gated · 6.51mm/px · 6 of 512 frames shown]
[frame 43/512]
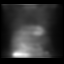
[frame 128/512]
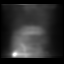
[frame 214/512]
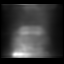
[frame 299/512]
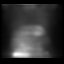
[frame 384/512]
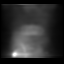
[frame 470/512]
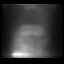

[Series 2: stress - perfusion · 6.51mm/px · 6 of 64 frames shown]
[frame 6/64]
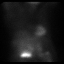
[frame 16/64]
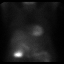
[frame 27/64]
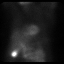
[frame 38/64]
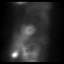
[frame 48/64]
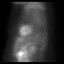
[frame 59/64]
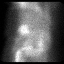

[Series 3: rest · 6.51mm/px · 6 of 64 frames shown]
[frame 6/64]
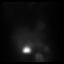
[frame 16/64]
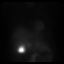
[frame 27/64]
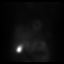
[frame 38/64]
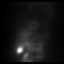
[frame 48/64]
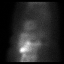
[frame 59/64]
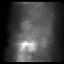

[Series 3: wbr_r-proj_st rest · 6.51mm/px · 6 of 64 frames shown]
[frame 6/64]
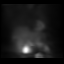
[frame 16/64]
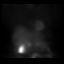
[frame 27/64]
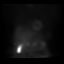
[frame 38/64]
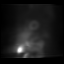
[frame 48/64]
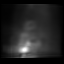
[frame 59/64]
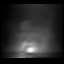

[30 of 30 positions shown; findings below may reference images not displayed]

Canned report from images found in remote index.

Refer to host system for actual result text.

## 2018-04-24 MED ORDER — TECHNETIUM TC 99M TETROFOSMIN IV KIT
30.7000 | PACK | Freq: Once | INTRAVENOUS | Status: AC | PRN
  Administered 2018-04-24: 30.7 via INTRAVENOUS
  Filled 2018-04-24: qty 31

## 2018-04-24 NOTE — Telephone Encounter (Signed)
Pt is scheduled for GI consult with Dr Chales AbrahamsGupta on 06/04/18.

## 2018-04-25 ENCOUNTER — Ambulatory Visit (HOSPITAL_COMMUNITY): Payer: BLUE CROSS/BLUE SHIELD | Attending: Cardiovascular Disease

## 2018-04-25 ENCOUNTER — Other Ambulatory Visit: Payer: Self-pay

## 2018-04-25 ENCOUNTER — Ambulatory Visit (HOSPITAL_BASED_OUTPATIENT_CLINIC_OR_DEPARTMENT_OTHER): Payer: BLUE CROSS/BLUE SHIELD

## 2018-04-25 DIAGNOSIS — I129 Hypertensive chronic kidney disease with stage 1 through stage 4 chronic kidney disease, or unspecified chronic kidney disease: Secondary | ICD-10-CM | POA: Insufficient documentation

## 2018-04-25 DIAGNOSIS — R0602 Shortness of breath: Secondary | ICD-10-CM

## 2018-04-25 DIAGNOSIS — E1122 Type 2 diabetes mellitus with diabetic chronic kidney disease: Secondary | ICD-10-CM | POA: Insufficient documentation

## 2018-04-25 DIAGNOSIS — I10 Essential (primary) hypertension: Secondary | ICD-10-CM | POA: Diagnosis present

## 2018-04-25 DIAGNOSIS — N183 Chronic kidney disease, stage 3 (moderate): Secondary | ICD-10-CM | POA: Insufficient documentation

## 2018-04-25 DIAGNOSIS — Z6841 Body Mass Index (BMI) 40.0 and over, adult: Secondary | ICD-10-CM | POA: Diagnosis not present

## 2018-04-25 DIAGNOSIS — E785 Hyperlipidemia, unspecified: Secondary | ICD-10-CM | POA: Insufficient documentation

## 2018-04-25 LAB — MYOCARDIAL PERFUSION IMAGING
CHL CUP MPHR: 163 {beats}/min
CHL CUP NUCLEAR SRS: 1
CHL CUP NUCLEAR SSS: 8
CSEPEDS: 0 s
CSEPPHR: 160 {beats}/min
Estimated workload: 4.6 METS
Exercise duration (min): 5 min
LHR: 0.32
LV sys vol: 18 mL
LVDIAVOL: 53 mL (ref 46–106)
Percent HR: 98 %
Rest HR: 99 {beats}/min
SDS: 7
TID: 0.81

## 2018-04-25 MED ORDER — TECHNETIUM TC 99M TETROFOSMIN IV KIT
30.4000 | PACK | Freq: Once | INTRAVENOUS | Status: AC | PRN
  Administered 2018-04-25: 30.4 via INTRAVENOUS
  Filled 2018-04-25: qty 31

## 2018-04-29 ENCOUNTER — Telehealth: Payer: Self-pay

## 2018-04-29 NOTE — Telephone Encounter (Signed)
Patient notified of stable echocardiogram results per Dr Tomie Chinaevankar.  Patient verbalized understanding.

## 2018-04-29 NOTE — Telephone Encounter (Signed)
Left message to return call for echocardiogram results.

## 2018-04-29 NOTE — Telephone Encounter (Signed)
-----   Message from Garwin Brothersajan R Revankar, MD sent at 04/26/2018 11:50 AM EDT ----- The results of the study is unremarkable. Please inform patient. I will discuss in detail at next appointment. Cc  primary care/referring physician Garwin Brothersajan R Revankar, MD 04/26/2018 11:50 AM

## 2018-05-21 ENCOUNTER — Other Ambulatory Visit: Payer: Self-pay | Admitting: Family

## 2018-05-24 LAB — HM DIABETES EYE EXAM

## 2018-06-04 ENCOUNTER — Encounter: Payer: Self-pay | Admitting: Gastroenterology

## 2018-06-04 ENCOUNTER — Ambulatory Visit (INDEPENDENT_AMBULATORY_CARE_PROVIDER_SITE_OTHER): Payer: BLUE CROSS/BLUE SHIELD | Admitting: Gastroenterology

## 2018-06-04 VITALS — BP 126/72 | HR 66 | Ht 59.0 in | Wt 244.0 lb

## 2018-06-04 DIAGNOSIS — R195 Other fecal abnormalities: Secondary | ICD-10-CM | POA: Diagnosis not present

## 2018-06-04 DIAGNOSIS — D638 Anemia in other chronic diseases classified elsewhere: Secondary | ICD-10-CM | POA: Diagnosis not present

## 2018-06-04 NOTE — Progress Notes (Signed)
Chief Complaint:   Referring Provider:  Sandford Craze'Sullivan, Melissa, NP      ASSESSMENT AND PLAN;   #1.  Heme Positive Stools: neg colon at [facility] 03/02/2017 by Dr Rocky CraftsJu. Plan: - EGD with SB Bx - If still with problems, will perform additional WU by means of CT scan of the abdomen and pelvis possibly followed by capsule endoscopy if she were to require blood transfusions.   #2.  Anemia of chronic disease due to chronic renal insufficiency with heme positive stools   HPI:    Janet Rose is a 57 y.o. female  With heme positive stools And mild anemia with hemoglobin 10.0, MCV 82 She also has CRI with the creatinine 1.67, 2 months ago.  She is being followed by nephrology.  Had negative colonoscopy 03/02/2017 by Dr. Rocky CraftsJu No nausea, vomiting, heartburn, regurgitation, odynophagia or dysphagia.  No significant diarrhea or constipation.  There is no melena or hematochezia. No unintentional weight loss.   Past Medical History:  Diagnosis Date  . Chicken pox   . Colon polyps   . Diabetes mellitus without complication (HCC)   . Hyperlipidemia   . Hypertension     Past Surgical History:  Procedure Laterality Date  . CESAREAN SECTION  1991  . CESAREAN SECTION  1994  . COLONOSCOPY     1 year ago at [facility]. Normal per pt    Family History  Problem Relation Age of Onset  . Arthritis Mother   . Heart disease Mother   . Hyperlipidemia Mother   . Hypertension Mother   . Kidney disease Mother   . Stroke Mother   . Heart attack Mother   . Depression Sister   . Drug abuse Sister   . Early death Sister        ?accidental overdose  . Cancer Brother        prostate  . Diabetes Brother   . Asthma Son   . Learning disabilities Son   . Mental illness Son   . Asthma Sister   . Diabetes Sister   . Hypertension Sister   . Drug abuse Brother   . Diabetes Brother   . Heart attack Brother   . Learning disabilities Brother   . Drug abuse Brother   . Colon cancer Maternal Aunt    at age 57     Social History   Tobacco Use  . Smoking status: Never Smoker  . Smokeless tobacco: Never Used  Substance Use Topics  . Alcohol use: Yes    Frequency: Never    Comment: occasionally  . Drug use: No    Current Outpatient Medications  Medication Sig Dispense Refill  . glipiZIDE (GLUCOTROL XL) 2.5 MG 24 hr tablet Take 1 tablet (2.5 mg total) by mouth daily with breakfast. 30 tablet 5  . Lancets (FREESTYLE) lancets Use as instructed to check blood sugar once a day.  DX E11.65 100 each 1  . losartan-hydrochlorothiazide (HYZAAR) 50-12.5 MG tablet TAKE 1 TABLET BY MOUTH DAILY 90 tablet 1  . pioglitazone (ACTOS) 30 MG tablet Take 1 tablet (30 mg total) by mouth daily. 30 tablet 2  . simvastatin (ZOCOR) 40 MG tablet Take 1 tablet (40 mg total) by mouth at bedtime. 30 tablet 5  . sitaGLIPtin (JANUVIA) 50 MG tablet Take 1 tablet (50 mg total) by mouth daily. 30 tablet 3  . Vitamin D, Ergocalciferol, (DRISDOL) 50000 units CAPS capsule TK 1 C PO 1 TIME A WK  5  .  aspirin EC 81 MG tablet Take 1 tablet (81 mg total) by mouth daily. (Patient not taking: Reported on 06/04/2018) 90 tablet 3   No current facility-administered medications for this visit.     Allergies  Allergen Reactions  . Lisinopril Other (See Comments)    Other reaction(s): COUGH Other reaction(s): COUGH     Review of Systems:  Constitutional: Denies fever, chills, diaphoresis, appetite change and fatigue.  HEENT: Denies photophobia, eye pain, redness, hearing loss, ear pain, congestion, sore throat, rhinorrhea, sneezing, mouth sores, neck pain, neck stiffness and tinnitus.   Respiratory: Denies SOB, DOE, cough, chest tightness,  and wheezing.   Cardiovascular: Denies chest pain, palpitations and leg swelling.  Genitourinary: Denies dysuria, urgency, frequency, hematuria, flank pain and difficulty urinating.  Musculoskeletal: Denies myalgias, back pain, joint swelling, arthralgias and gait problem.  Skin: No  rash.  Neurological: Denies dizziness, seizures, syncope, weakness, light-headedness, numbness and headaches.  Hematological: Denies adenopathy. Easy bruising, personal or family bleeding history  Psychiatric/Behavioral: No anxiety or depression     Physical Exam:    BP 126/72   Pulse 66   Ht 4\' 11"  (1.499 m)   Wt 244 lb (110.7 kg)   BMI 49.28 kg/m  Filed Weights   06/04/18 1107  Weight: 244 lb (110.7 kg)   Constitutional:  Well-developed, in no acute distress. Psychiatric: Normal mood and affect. Behavior is normal. HEENT: Pupils normal.  Conjunctivae are normal. No scleral icterus. Neck supple.  Cardiovascular: Normal rate, regular rhythm. No edema Pulmonary/chest: Effort normal and breath sounds normal. No wheezing, rales or rhonchi. Abdominal: Soft, nondistended. Nontender. Bowel sounds active throughout. There are no masses palpable. No hepatomegaly. Rectal:  defered Neurological: Alert and oriented to person place and time. Skin: Skin is warm and dry. No rashes noted.  Data Reviewed: I have personally reviewed following labs and imaging studies  CBC: CBC Latest Ref Rng & Units 04/05/2018  WBC 3.8 - 10.8 Thousand/uL 8.0  Hemoglobin 11.7 - 15.5 g/dL 10.0(L)  Hematocrit 35.0 - 45.0 % 29.6(L)  Platelets 140 - 400 Thousand/uL 268    CMP: CMP Latest Ref Rng & Units 04/05/2018 01/04/2018  Glucose 65 - 99 mg/dL 564(P) 329(J)  BUN 7 - 25 mg/dL 18(A) 41(Y)  Creatinine 0.50 - 1.05 mg/dL 6.06(T) 0.16(W)  Sodium 135 - 146 mmol/L 136 140  Potassium 3.5 - 5.3 mmol/L 4.4 4.4  Chloride 98 - 110 mmol/L 102 105  CO2 20 - 32 mmol/L 24 26  Calcium 8.6 - 10.4 mg/dL 9.6 9.5  Total Protein 6.1 - 8.1 g/dL 7.4 7.5  Total Bilirubin 0.2 - 1.2 mg/dL 0.5 0.3  AST 10 - 35 U/L 20 16  ALT 6 - 29 U/L 14 13  Seen in presence of Trisha Williams CMA    Edman Circle, MD 06/04/2018, 11:18 AM  Cc: Sandford Craze, NP

## 2018-06-04 NOTE — Patient Instructions (Signed)
If you are age 57 or older, your body mass index should be between 23-30. Your Body mass index is 49.28 kg/m. If this is out of the aforementioned range listed, please consider follow up with your Primary Care Provider.  If you are age 57 or younger, your body mass index should be between 19-25. Your Body mass index is 49.28 kg/m. If this is out of the aformentioned range listed, please consider follow up with your Primary Care Provider.   You have been scheduled for an endoscopy. Please follow written instructions given to you at your visit today. If you use inhalers (even only as needed), please bring them with you on the day of your procedure. Your physician has requested that you go to www.[email or web address].com and enter the access code given to you at your visit today. This web site gives a general overview about your procedure. However, you should still follow specific instructions given to you by our office regarding your preparation for the procedure.  Thank you,  Dr. Lynann Bolognaajesh Gupta

## 2018-06-19 ENCOUNTER — Encounter: Payer: Self-pay | Admitting: Obstetrics & Gynecology

## 2018-06-19 ENCOUNTER — Ambulatory Visit (INDEPENDENT_AMBULATORY_CARE_PROVIDER_SITE_OTHER): Payer: BLUE CROSS/BLUE SHIELD | Admitting: Obstetrics & Gynecology

## 2018-06-19 VITALS — BP 109/70 | HR 88 | Ht 59.0 in | Wt 244.0 lb

## 2018-06-19 DIAGNOSIS — Z01419 Encounter for gynecological examination (general) (routine) without abnormal findings: Secondary | ICD-10-CM | POA: Diagnosis not present

## 2018-06-19 DIAGNOSIS — Z1151 Encounter for screening for human papillomavirus (HPV): Secondary | ICD-10-CM

## 2018-06-19 DIAGNOSIS — Z124 Encounter for screening for malignant neoplasm of cervix: Secondary | ICD-10-CM | POA: Diagnosis not present

## 2018-06-19 NOTE — Progress Notes (Signed)
Subjective:     Shirlean KellyMary Menden is a 57 y.o. female here for a routine exam. LMP 2013 G3P2012  Current complaints: no GYN problems. Pt was recently dx with anemia. She is scheduled for a EGD.  No leakage of urine.     Gynecologic History No LMP recorded. (Menstrual status: Other). Contraception: post menopausal status Last Pap: 03/2017. Results were: normal done at [facility]   Last mammogram: 10/2017. Results were: normal  Obstetric History OB History  Gravida Para Term Preterm AB Living  3 2 2   1 2   SAB TAB Ectopic Multiple Live Births  1       2    # Outcome Date GA Lbr Len/2nd Weight Sex Delivery Anes PTL Lv  3 Term 1994 7835w0d   F CS-Classical EPI  LIV  2 Term 1991 4435w0d   M CS-Classical Spinal  LIV  1 SAB 1989             The following portions of the patient's history were reviewed and updated as appropriate: allergies, current medications, past family history, past medical history, past social history, past surgical history and problem list.  Review of Systems Pertinent items are noted in HPI.    Objective:  BP 109/70   Pulse 88   Ht 4\' 11"  (1.499 m)   Wt 244 lb 0.6 oz (110.7 kg)   BMI 49.29 kg/m  General Appearance:    Alert, cooperative, no distress, appears stated age  Head:    Normocephalic, without obvious abnormality, atraumatic  Eyes:    conjunctiva/corneas clear, EOM's intact, both eyes  Ears:    Normal external ear canals, both ears  Nose:   Nares normal, septum midline, mucosa normal, no drainage    or sinus tenderness  Throat:   Lips, mucosa, and tongue normal; teeth and gums normal  Neck:   Supple, symmetrical, trachea midline, no adenopathy;    thyroid:  no enlargement/tenderness/nodules  Back:     Symmetric, no curvature, ROM normal, no CVA tenderness  Lungs:     Clear to auscultation bilaterally, respirations unlabored  Chest Wall:    No tenderness or deformity   Heart:    Regular rate and rhythm, S1 and S2 normal, no murmur, rub   or gallop  Breast  Exam:    No tenderness, masses, or nipple abnormality  Abdomen:     Soft, non-tender, bowel sounds active all four quadrants,    no masses, no organomegaly  Genitalia:    Normal female without lesion, discharge or tenderness     Extremities:   Extremities normal, atraumatic, no cyanosis or edema  Pulses:   2+ and symmetric all extremities  Skin:   Skin color, texture, turgor normal, no rashes or lesions      Assessment:    Healthy female exam.    Plan:    Follow up in: 1 year.    F/u PAP and hrHPV  Jeret Goyer L. Harraway-Smith, M.D., Evern CoreFACOG

## 2018-06-24 LAB — CYTOLOGY - PAP
Diagnosis: NEGATIVE
HPV: NOT DETECTED

## 2018-07-01 ENCOUNTER — Encounter: Payer: Self-pay | Admitting: Gastroenterology

## 2018-07-01 ENCOUNTER — Ambulatory Visit (AMBULATORY_SURGERY_CENTER): Payer: BLUE CROSS/BLUE SHIELD | Admitting: Gastroenterology

## 2018-07-01 VITALS — BP 104/66 | HR 83 | Temp 98.9°F | Resp 17 | Ht 59.0 in | Wt 244.0 lb

## 2018-07-01 DIAGNOSIS — R195 Other fecal abnormalities: Secondary | ICD-10-CM | POA: Diagnosis present

## 2018-07-01 DIAGNOSIS — D508 Other iron deficiency anemias: Secondary | ICD-10-CM

## 2018-07-01 MED ORDER — SODIUM CHLORIDE 0.9 % IV SOLN
500.0000 mL | Freq: Once | INTRAVENOUS | Status: DC
Start: 2018-07-01 — End: 2018-07-01

## 2018-07-01 NOTE — Progress Notes (Signed)
Called to room to assist during endoscopic procedure.  Patient ID and intended procedure confirmed with present staff. Received instructions for my participation in the procedure from the performing physician.  

## 2018-07-01 NOTE — Patient Instructions (Signed)
YOU HAD AN ENDOSCOPIC PROCEDURE TODAY AT THE [facility]:   Refer to the procedure report that was given to you for any specific questions about what was found during the examination.  If the procedure report does not answer your questions, please call your gastroenterologist to clarify.  If you requested that your care partner not be given the details of your procedure findings, then the procedure report has been included in a sealed envelope for you to review at your convenience later.  YOU SHOULD EXPECT: Some feelings of bloating in the abdomen. Passage of more gas than usual.  Walking can help get rid of the air that was put into your GI tract during the procedure and reduce the bloating. If you had a lower endoscopy (such as a colonoscopy or flexible sigmoidoscopy) you may notice spotting of blood in your stool or on the toilet paper. If you underwent a bowel prep for your procedure, you may not have a normal bowel movement for a few days.  Please Note:  You might notice some irritation and congestion in your nose or some drainage.  This is from the oxygen used during your procedure.  There is no need for concern and it should clear up in a day or so.  SYMPTOMS TO REPORT IMMEDIATELY:   Following upper endoscopy (EGD)  Vomiting of blood or coffee ground material  New chest pain or pain under the shoulder blades  Painful or persistently difficult swallowing  New shortness of breath  Fever of 100F or higher  Black, tarry-looking stools  For urgent or emergent issues, a gastroenterologist can be reached at any hour by calling [phone].   DIET:  We do recommend a small meal at first, but then you may proceed to your regular diet.  Drink plenty of fluids but you should avoid alcoholic beverages for 24 hours.  ACTIVITY:  You should plan to take it easy for the rest of today and you should NOT DRIVE or use heavy machinery until tomorrow (because of the sedation medicines used  during the test).    FOLLOW UP: Our staff will call the number listed on your records the next business day following your procedure to check on you and address any questions or concerns that you may have regarding the information given to you following your procedure. If we do not reach you, we will leave a message.  However, if you are feeling well and you are not experiencing any problems, there is no need to return our call.  We will assume that you have returned to your regular daily activities without incident.  If any biopsies were taken you will be contacted by phone or by letter within the next 1-3 weeks.  Please call us at [phone] if you have not heard about the biopsies in 3 weeks.    SIGNATURES/CONFIDENTIALITY: You and/or your care partner have signed paperwork which will be entered into your electronic medical record.  These signatures attest to the fact that that the information above on your After Visit Summary has been reviewed and is understood.  Full responsibility of the confidentiality of this discharge information lies with you and/or your care-partner. 

## 2018-07-01 NOTE — Progress Notes (Signed)
Report to PACU, RN, vss, BBS= Clear.  

## 2018-07-01 NOTE — Op Note (Signed)
[facility] Patient Name: Janet KellyMary Rose Procedure Date: 07/01/2018 3:35 PM MRN: [id number] Endoscopist: Lynann Bolognaajesh [name] , MD Age: [id number] Referring MD:  Date of Birth: 04/20/1961 Gender: Female Account #: [id number] Procedure:                Upper GI endoscopy Indications:              Heme positive stool Medicines:                Monitored Anesthesia Care Procedure:                Pre-Anesthesia Assessment:                           - Prior to the procedure, a History and Physical                            was performed, and patient medications and                            allergies were reviewed. The patient's tolerance of                            previous anesthesia was also reviewed. The risks                            and benefits of the procedure and the sedation                            options and risks were discussed with the patient.                            All questions were answered, and informed consent                            was obtained. Prior Anticoagulants: The patient has                            taken no previous anticoagulant or antiplatelet                            agents. ASA Grade Assessment: II - A patient with                            mild systemic disease. After reviewing the risks                            and benefits, the patient was deemed in                            satisfactory condition to undergo the procedure.                           After obtaining informed consent, the endoscope was  passed under direct vision. Throughout the                            procedure, the patient's blood pressure, pulse, and                            oxygen saturations were monitored continuously. The                            Endoscope was introduced through the mouth, and                            advanced to the second part of duodenum. The upper                            GI endoscopy was accomplished without  difficulty.                            The patient tolerated the procedure well. Scope In: Scope Out: Findings:                 The examined esophagus was normal.                           The entire examined stomach was normal. Biopsies                            were taken with a cold forceps for histology.                           The in the duodenum was normal. Biopsies for                            histology were taken with a cold forceps for                            evaluation of celiac disease. Complications:            No immediate complications. Estimated Blood Loss:     Estimated blood loss: none. Impression:               - Normal EGD. Recommendation:           - Patient has a contact number available for                            emergencies. The signs and symptoms of potential                            delayed complications were discussed with the                            patient. Return to normal activities tomorrow.                            Written discharge instructions were provided to  the                            patient.                           - Resume previous diet.                           - Continue present medications.                           - Await pathology results.                           - Return to GI clinic in 12 weeks. Lynann Bolognaajesh Syna Gad, MD 07/01/2018 3:48:29 PM This report has been signed electronically.

## 2018-07-02 ENCOUNTER — Telehealth: Payer: Self-pay | Admitting: *Deleted

## 2018-07-02 ENCOUNTER — Telehealth: Payer: Self-pay

## 2018-07-02 NOTE — Telephone Encounter (Signed)
No answer, left message to call back later today, B.Taran Hable RN. 

## 2018-07-02 NOTE — Telephone Encounter (Signed)
No answer, left message to call if questions or concerns. 

## 2018-07-06 ENCOUNTER — Encounter: Payer: Self-pay | Admitting: Gastroenterology

## 2018-07-10 ENCOUNTER — Ambulatory Visit: Payer: BLUE CROSS/BLUE SHIELD | Admitting: Family

## 2018-07-10 DIAGNOSIS — Z0289 Encounter for other administrative examinations: Secondary | ICD-10-CM

## 2018-07-19 ENCOUNTER — Encounter: Payer: Self-pay | Admitting: Family

## 2018-07-22 ENCOUNTER — Encounter: Payer: Self-pay | Admitting: Family

## 2018-07-22 ENCOUNTER — Ambulatory Visit: Payer: BLUE CROSS/BLUE SHIELD | Admitting: Family

## 2018-07-22 VITALS — BP 99/75 | HR 89 | Temp 98.3°F | Resp 18 | Ht 59.5 in | Wt 244.2 lb

## 2018-07-22 DIAGNOSIS — R195 Other fecal abnormalities: Secondary | ICD-10-CM | POA: Diagnosis not present

## 2018-07-22 DIAGNOSIS — D649 Anemia, unspecified: Secondary | ICD-10-CM

## 2018-07-22 DIAGNOSIS — E1165 Type 2 diabetes mellitus with hyperglycemia: Secondary | ICD-10-CM | POA: Diagnosis not present

## 2018-07-22 DIAGNOSIS — I10 Essential (primary) hypertension: Secondary | ICD-10-CM | POA: Diagnosis not present

## 2018-07-22 NOTE — Progress Notes (Signed)
Subjective:    Patient ID: Janet Rose, female    DOB: [birth year], 57 y.o.   MRN: [id number]  HPI  Patient presents today for follow up.  DM2- reports that she went out of town and forgot her meds.    Wt Readings from Last 3 Encounters:  07/22/18 244 lb 3.2 oz (110.8 kg)  07/01/18 244 lb (110.7 kg)  06/19/18 244 lb 0.6 oz (110.7 kg)  walks one day a week and zumba 2 days a week.   Lab Results  Component Value Date   HGBA1C 10.9 (H) 04/05/2018   HGBA1C 9.0 (H) 01/04/2018   Lab Results  Component Value Date   MICROALBUR 0.3 01/04/2018   LDLCALC 136 (H) 04/05/2018   CREATININE 1.67 (H) 04/05/2018   HTN- maintained on hyzaar.    BP Readings from Last 3 Encounters:  07/01/18 104/66  06/19/18 109/70  06/04/18 126/72   Heme + Stool- had EGD per GI.     Review of Systems See HPI  Past Medical History:  Diagnosis Date  . Chicken pox   . Colon polyps   . Diabetes mellitus without complication (HCC)   . Hyperlipidemia   . Hypertension      Social History   Socioeconomic History  . Marital status: Divorced    Spouse name: Not on file  . Number of children: Not on file  . Years of education: Not on file  . Highest education level: Not on file  Occupational History  . Not on file  Social Needs  . Financial resource strain: Not on file  . Food insecurity:    Worry: Not on file    Inability: Not on file  . Transportation needs:    Medical: Not on file    Non-medical: Not on file  Tobacco Use  . Smoking status: Never Smoker  . Smokeless tobacco: Never Used  Substance and Sexual Activity  . Alcohol use: Yes    Frequency: Never    Comment: occasionally  . Drug use: No  . Sexual activity: Not Currently    Birth control/protection: None  Lifestyle  . Physical activity:    Days per week: Not on file    Minutes per session: Not on file  . Stress: Not on file  Relationships  . Social connections:    Talks on phone: Not on file    Gets together: Not on  file    Attends religious service: Not on file    Active member of club or organization: Not on file    Attends meetings of clubs or organizations: Not on file    Relationship status: Not on file  . Intimate partner violence:    Fear of current or ex partner: Not on file    Emotionally abused: Not on file    Physically abused: Not on file    Forced sexual activity: Not on file  Other Topics Concern  . Not on file  Social History Narrative   Lives with daughter   Works as an Secretary/administrator (makes cakes)   No pets   1 grandson in the [place]    Past Surgical History:  Procedure Laterality Date  . BUNIONECTOMY Right 2008  . CESAREAN SECTION  1991  . CESAREAN SECTION  1994  . COLONOSCOPY     1 year ago at [facility]. Normal per pt  . ROTATOR CUFF REPAIR Left 2011    Family History  Problem Relation Age of Onset  .  Arthritis Mother   . Heart disease Mother   . Hyperlipidemia Mother   . Hypertension Mother   . Kidney disease Mother   . Stroke Mother   . Heart attack Mother   . Depression Sister   . Drug abuse Sister   . Early death Sister        ?accidental overdose  . Cancer Brother        prostate  . Diabetes Brother   . Asthma Son   . Learning disabilities Son   . Mental illness Son   . Asthma Sister   . Diabetes Sister   . Hypertension Sister   . Drug abuse Brother   . Diabetes Brother   . Heart attack Brother   . Learning disabilities Brother   . Drug abuse Brother   . Colon cancer Maternal Aunt        at age 23     Allergies  Allergen Reactions  . Lisinopril Other (See Comments)    Other reaction(s): COUGH Other reaction(s): COUGH     Current Outpatient Medications on File Prior to Visit  Medication Sig Dispense Refill  . glipiZIDE (GLUCOTROL XL) 2.5 MG 24 hr tablet Take 1 tablet (2.5 mg total) by mouth daily with breakfast. 30 tablet 5  . Lancets (FREESTYLE) lancets Use as instructed to check blood sugar once a day.  DX E11.65 100  each 1  . losartan-hydrochlorothiazide (HYZAAR) 50-12.5 MG tablet TAKE 1 TABLET BY MOUTH DAILY 90 tablet 1  . simvastatin (ZOCOR) 40 MG tablet Take 1 tablet (40 mg total) by mouth at bedtime. 30 tablet 5  . sitaGLIPtin (JANUVIA) 50 MG tablet Take 1 tablet (50 mg total) by mouth daily. 30 tablet 3  . Vitamin D, Ergocalciferol, (DRISDOL) 50000 units CAPS capsule TK 1 C PO 1 TIME A WK  5  . pioglitazone (ACTOS) 30 MG tablet Take 1 tablet (30 mg total) by mouth daily. (Patient not taking: Reported on 07/22/2018) 30 tablet 2   No current facility-administered medications on file prior to visit.     Pulse 99   Temp 98.3 F (36.8 C) (Oral)   Resp 18   Ht 4' 11.5" (1.511 m)   Wt 244 lb 3.2 oz (110.8 kg)   SpO2 98%   BMI 48.50 kg/m       Objective:   Physical Exam  Constitutional: She appears well-developed and well-nourished.  Cardiovascular: Normal rate, regular rhythm and normal heart sounds.  No murmur heard. Pulmonary/Chest: Effort normal and breath sounds normal. No respiratory distress. She has no wheezes.  Psychiatric: She has a normal mood and affect. Her behavior is normal. Judgment and thought content normal.          Assessment & Plan:  DM2- reinforced importance of compliance with diet and exercise/weight loss.  Suggested that she try counting calories using myfitness pal.  She will start actos.  Declines flu shot.   HTN- BP stable. Continue current meds.  BP Readings from Last 3 Encounters:  07/01/18 104/66  06/19/18 109/70  06/04/18 126/72    Heme + stool- denies BRBPR.  Check follow up cbc. If h/h  lower refer back to GI.

## 2018-07-22 NOTE — Patient Instructions (Addendum)
Set up my fitness pal app with goal weight loss of 1-2 pounds per week.   Continue regular exercise.

## 2018-07-23 ENCOUNTER — Telehealth: Payer: Self-pay | Admitting: Family

## 2018-07-23 DIAGNOSIS — N289 Disorder of kidney and ureter, unspecified: Secondary | ICD-10-CM

## 2018-07-23 LAB — CBC WITH DIFFERENTIAL/PLATELET
BASOS PCT: 1.2 % (ref 0.0–3.0)
Basophils Absolute: 0.1 10*3/uL (ref 0.0–0.1)
EOS PCT: 2.4 % (ref 0.0–5.0)
Eosinophils Absolute: 0.2 10*3/uL (ref 0.0–0.7)
HEMATOCRIT: 32.7 % — AB (ref 36.0–46.0)
HEMOGLOBIN: 10.7 g/dL — AB (ref 12.0–15.0)
LYMPHS PCT: 35.1 % (ref 12.0–46.0)
Lymphs Abs: 2.8 10*3/uL (ref 0.7–4.0)
MCHC: 32.7 g/dL (ref 30.0–36.0)
MCV: 83.4 fl (ref 78.0–100.0)
Monocytes Absolute: 0.5 10*3/uL (ref 0.1–1.0)
Monocytes Relative: 5.8 % (ref 3.0–12.0)
Neutro Abs: 4.4 10*3/uL (ref 1.4–7.7)
Neutrophils Relative %: 55.5 % (ref 43.0–77.0)
PLATELETS: 254 10*3/uL (ref 150.0–400.0)
RBC: 3.92 Mil/uL (ref 3.87–5.11)
RDW: 13.9 % (ref 11.5–15.5)
WBC: 8 10*3/uL (ref 4.0–10.5)

## 2018-07-23 LAB — BASIC METABOLIC PANEL
BUN: 45 mg/dL — AB (ref 6–23)
CALCIUM: 9.3 mg/dL (ref 8.4–10.5)
CO2: 29 mEq/L (ref 19–32)
CREATININE: 1.73 mg/dL — AB (ref 0.40–1.20)
Chloride: 102 mEq/L (ref 96–112)
GFR: 38.95 mL/min — AB (ref >60.00)
GLUCOSE: 178 mg/dL — AB (ref 70–99)
Potassium: 4.9 mEq/L (ref 3.5–5.1)
Sodium: 139 mEq/L (ref 135–145)

## 2018-07-23 LAB — HEMOGLOBIN A1C: Hgb A1c MFr Bld: 8.7 % — ABNORMAL HIGH (ref 4.6–6.5)

## 2018-07-23 MED ORDER — SIMVASTATIN 40 MG PO TABS: 40.0000 mg | ORAL_TABLET | Freq: Every day | ORAL | 5 refills | Status: AC

## 2018-07-23 MED ORDER — PIOGLITAZONE HCL 30 MG PO TABS: 30.0000 mg | ORAL_TABLET | Freq: Every day | ORAL | 2 refills | Status: DC

## 2018-07-23 NOTE — Telephone Encounter (Signed)
Left message on machine for pt to return my call. Ok for [facility] / triage to discuss results with pt.

## 2018-07-23 NOTE — Telephone Encounter (Signed)
Pt returning call and information relayed to pt per notes of Melissa,NP on 07/23/18. Pt verbalized understanding. Pt states she is currently seeing a kidney specialist, Dr. Janit Pagan and has an appt on 08/19/18. Pt states that when she went to [facility] in [place] on yesterday she was told that they did not have a prescription for Pioglitazone (Actos). Previous prescriptions for Actos and Zocar were noted to be sent to pharmacy on 04/06/18 but the pt states she was not made aware of new prescription of Actos or dosage increase of Zocor. Pt reports that she was never made aware that she should be taking Simvastatin 40 mg daily and did not start new dosage until today. Pt currently has a prescription for 20mg  tab and is doubling up on tabs to equal current dose of 40mg  and will be out of current prescription before 30 days. Pt will need a new prescription for Actos and Simvastatin sent to [facility] in [place].

## 2018-07-23 NOTE — Telephone Encounter (Signed)
Please let patient know that her sugar remains uncontrolled but appears better than the last time it was checked.  I would recommend that she add the Actos as we discussed yesterday.  Kidney function has worsened very slightly.  I would recommend that we refer her to a kidney specialist for ongoing evaluation of her kidneys.  In addition getting her sugars under control will help to protect her kidneys further damage.

## 2018-07-23 NOTE — Telephone Encounter (Signed)
Refills have been sent.  Please notify pt.

## 2018-07-23 NOTE — Telephone Encounter (Signed)
Notified pt. 

## 2018-07-29 ENCOUNTER — Ambulatory Visit: Payer: BLUE CROSS/BLUE SHIELD

## 2018-07-29 ENCOUNTER — Other Ambulatory Visit (INDEPENDENT_AMBULATORY_CARE_PROVIDER_SITE_OTHER): Payer: BLUE CROSS/BLUE SHIELD

## 2018-07-29 DIAGNOSIS — Z0184 Encounter for antibody response examination: Secondary | ICD-10-CM | POA: Diagnosis not present

## 2018-07-29 DIAGNOSIS — Z111 Encounter for screening for respiratory tuberculosis: Secondary | ICD-10-CM

## 2018-07-31 ENCOUNTER — Ambulatory Visit (INDEPENDENT_AMBULATORY_CARE_PROVIDER_SITE_OTHER): Payer: BLUE CROSS/BLUE SHIELD

## 2018-07-31 DIAGNOSIS — Z111 Encounter for screening for respiratory tuberculosis: Secondary | ICD-10-CM

## 2018-07-31 LAB — TB SKIN TEST
INDURATION: 0 mm
TB SKIN TEST: NEGATIVE

## 2018-08-01 ENCOUNTER — Encounter: Payer: Self-pay | Admitting: Family

## 2018-08-01 LAB — MEASLES/MUMPS/RUBELLA IMMUNITY
RUBELLA: 6.75 {index}
Rubeola IgG: >300 AU/mL

## 2018-08-20 ENCOUNTER — Telehealth: Payer: Self-pay | Admitting: Family

## 2018-08-20 DIAGNOSIS — R195 Other fecal abnormalities: Secondary | ICD-10-CM

## 2018-08-20 NOTE — Telephone Encounter (Signed)
Copied from CRM [id number]. Topic: General - Other >> Aug 20, 2018  3:10 PM Leafy Ro wrote: Reason for CRM:pt received a letter the losartan-hctz  has been recalled. Pt would like another rx [facility] to [facility] in [place] [address]

## 2018-08-20 NOTE — Telephone Encounter (Signed)
Please schedule pt an appointment with me this week.

## 2018-08-20 NOTE — Telephone Encounter (Signed)
Copied from CRM [id number]. Topic: General - Other >> Aug 20, 2018  3:18 PM Leafy Ro wrote: Reason for CRM:  pt saw kidney doctor yesterday. Pt hemoglobin was 10 now its 8. Please advise

## 2018-08-21 MED ORDER — LOSARTAN POTASSIUM-HCTZ 50-12.5 MG PO TABS: 1.0000 | ORAL_TABLET | Freq: Every day | ORAL | 1 refills | Status: DC

## 2018-08-21 NOTE — Telephone Encounter (Signed)
I spoke to her pharmacist and they stated that they pulled all of the bad lots and they have good lots in stock currently.  I have sent a refill and she should be able to pick up the first medication today.

## 2018-08-21 NOTE — Telephone Encounter (Signed)
Per Patient " pharmacy is constantly having a recall of this particular medication and she is nervous to take it at this point, will like to try something else".

## 2018-08-21 NOTE — Telephone Encounter (Signed)
Advised patient she needs appointment, she is hesitant to come in. She said she saw you recently and she will like to be referred to another GI Dr. For second opinion because "she is bleeding from somewhere".

## 2018-08-22 MED ORDER — OLMESARTAN MEDOXOMIL-HCTZ 20-12.5 MG PO TABS: 1.0000 | ORAL_TABLET | Freq: Every day | ORAL | 2 refills | Status: DC

## 2018-08-22 NOTE — Telephone Encounter (Signed)
Patient returned called advised about notes and patient stated that she does not want to come in for another ov because we already know that her blood level is dropping and she does not want to see Dr. Chales Abrahams because he should have done a ct and endoscopy first.  She would like to see another GI.  Would you be able to call her?  It looks like there is two messages.

## 2018-08-22 NOTE — Telephone Encounter (Signed)
Author phoned pt. To relay Melissa's message, no answer; author left generic VM asking for return call.

## 2018-08-22 NOTE — Telephone Encounter (Signed)
Attempted to call patient no answer.  Please contact pt and let her know that I have sent an rx to her pharmacy for olmesartan hct instead of losartan hct.  We will need to repeat her blood work and blood pressure in the office in 1-2 weeks please so I will need her to come in for an appointment.    In regards to her anemia- Dr. Chales Abrahams has additional plans for work up since it appears that she is continuing to bleed. These plans would include a CT of her abdomen and a possible capsule endoscopy (where she swallows a small camera) to look at the small bowel.  Her blood level is approaching a range where we would consider a transfusion so we need to keep a close eye on this.  I would recommend follow up with Dr. Chales Abrahams if she is willing for continuity of care and so we can get her in more quickly.  If she is agreeable, I will arrange follow up with Dr. Chales Abrahams.

## 2018-08-22 NOTE — Addendum Note (Signed)
Addended by: Sandford Craze on: 08/22/2018 04:19 PM   Modules accepted: Orders

## 2018-08-22 NOTE — Telephone Encounter (Signed)
Attempted to reach patient again. No answer. Left message advising pt that follow up is necessary in 1-2 weeks due to medication change to make sure that her blood pressure and blood work looks ok with med change. Also advised that I will place a referral to a new GI but this will likely take longer and we will need to monitor her blood count in the meantime to make sure that she does not require blood transfusion. Advised OV in 1-2 weeks.

## 2018-08-23 NOTE — Telephone Encounter (Signed)
Patient advised referral to new GI dr. Has been placed, she "is not satisfied with Dr. Chales Abrahams because she is still bleeding and since endoscopy she was not even given a f/up appointment. In the mean time hemoglobin is still going down. She will call on Monday for appointment with [facility] for BP foloow up.

## 2018-10-04 ENCOUNTER — Other Ambulatory Visit: Payer: Self-pay | Admitting: Family

## 2018-10-04 NOTE — Telephone Encounter (Signed)
Janet Rose--  Please see refill protocol failure and advise?

## 2018-10-09 ENCOUNTER — Ambulatory Visit: Payer: BLUE CROSS/BLUE SHIELD | Admitting: Family

## 2018-10-09 ENCOUNTER — Encounter: Payer: Self-pay | Admitting: Family

## 2018-10-09 VITALS — BP 112/55 | HR 83 | Temp 97.8°F | Ht 59.0 in | Wt 239.6 lb

## 2018-10-09 DIAGNOSIS — R195 Other fecal abnormalities: Secondary | ICD-10-CM

## 2018-10-09 DIAGNOSIS — I10 Essential (primary) hypertension: Secondary | ICD-10-CM | POA: Diagnosis not present

## 2018-10-09 DIAGNOSIS — R110 Nausea: Secondary | ICD-10-CM

## 2018-10-09 DIAGNOSIS — D649 Anemia, unspecified: Secondary | ICD-10-CM

## 2018-10-09 LAB — CBC WITH DIFFERENTIAL/PLATELET
BASOS ABS: 0 10*3/uL (ref 0.0–0.1)
Basophils Relative: 0.3 % (ref 0.0–3.0)
EOS ABS: 0.2 10*3/uL (ref 0.0–0.7)
Eosinophils Relative: 2 % (ref 0.0–5.0)
HCT: 29.1 % — ABNORMAL LOW (ref 36.0–46.0)
HEMOGLOBIN: 9.6 g/dL — AB (ref 12.0–15.0)
LYMPHS PCT: 36.2 % (ref 12.0–46.0)
Lymphs Abs: 3 10*3/uL (ref 0.7–4.0)
MCHC: 33 g/dL (ref 30.0–36.0)
MCV: 84 fl (ref 78.0–100.0)
MONOS PCT: 6.1 % (ref 3.0–12.0)
Monocytes Absolute: 0.5 10*3/uL (ref 0.1–1.0)
NEUTROS ABS: 4.6 10*3/uL (ref 1.4–7.7)
Neutrophils Relative %: 55.4 % (ref 43.0–77.0)
Platelets: 253 10*3/uL (ref 150.0–400.0)
RBC: 3.47 Mil/uL — ABNORMAL LOW (ref 3.87–5.11)
RDW: 13.4 % (ref 11.5–15.5)
WBC: 8.3 10*3/uL (ref 4.0–10.5)

## 2018-10-09 LAB — POCT HEMOGLOBIN: Hemoglobin: 8.8 g/dL — AB (ref 9.5–13.5)

## 2018-10-09 LAB — COMPREHENSIVE METABOLIC PANEL
ALK PHOS: 74 U/L (ref 39–117)
ALT: 13 U/L (ref 0–35)
AST: 16 U/L (ref 0–37)
Albumin: 3.9 g/dL (ref 3.5–5.2)
BILIRUBIN TOTAL: 0.5 mg/dL (ref 0.2–1.2)
BUN: 24 mg/dL — ABNORMAL HIGH (ref 6–23)
CALCIUM: 9.5 mg/dL (ref 8.4–10.5)
CO2: 30 mEq/L (ref 19–32)
CREATININE: 1.49 mg/dL — AB (ref 0.40–1.20)
Chloride: 104 mEq/L (ref 96–112)
GFR: 46.24 mL/min — AB (ref >60.00)
Glucose, Bld: 177 mg/dL — ABNORMAL HIGH (ref 70–99)
Potassium: 4.4 mEq/L (ref 3.5–5.1)
Sodium: 140 mEq/L (ref 135–145)
TOTAL PROTEIN: 7.6 g/dL (ref 6.0–8.3)

## 2018-10-09 MED ORDER — OMEPRAZOLE 40 MG PO CPDR: 40.0000 mg | DELAYED_RELEASE_CAPSULE | Freq: Every day | ORAL | 3 refills | Status: DC

## 2018-10-09 NOTE — Patient Instructions (Addendum)
Please complete lab work prior ot leaving.  Please call [facility] to schedule an appointment with Marliss CootsNicholas Izaj PA-C at [phone].  They should have your referral already.  Let me know if you have any trouble getting an appointment.  I am also going to refer you to hematology for further treatment of your anemia.   Keep up the good work with the weight loss. Let us know if your nausea worsens or if it fails to improve in the next few days.

## 2018-10-09 NOTE — Progress Notes (Signed)
Subjective:    Patient ID: Janet Rose, female    DOB: 09/28/1961, 57 y.o.   MRN: [id number]  HPI  Patient is a 57 year old female who presents today for follow-up.  Hypertension- she is maintained on benicar hct.  This was changed due to some recalls. Reports feeling well on this current dose of medication.denies dizziness/light headedness.  BP Readings from Last 3 Encounters:  10/09/18 (!) 112/55  07/22/18 99/75  07/01/18 104/66   Heme + stool- denies any obvious BRBPR.  We referred her to an outside GI last visit at her request and she reports that they did not call her. She does admit though that her phone number changed and that they might not have been able to reach her. She denies SOB/weakness or fatigue. She does report some nausea and occasional diarrhea.   Has been working on weight loss.   Wt Readings from Last 3 Encounters:  10/09/18 239 lb 9.6 oz (108.7 kg)  07/22/18 244 lb 3.2 oz (110.8 kg)  07/01/18 244 lb (110.7 kg)     Review of Systems See HPI  Past Medical History:  Diagnosis Date  . Chicken pox   . Colon polyps   . Diabetes mellitus without complication (HCC)   . Hyperlipidemia   . Hypertension      Social History   Socioeconomic History  . Marital status: Divorced    Spouse name: Not on file  . Number of children: Not on file  . Years of education: Not on file  . Highest education level: Not on file  Occupational History  . Not on file  Social Needs  . Financial resource strain: Not on file  . Food insecurity:    Worry: Not on file    Inability: Not on file  . Transportation needs:    Medical: Not on file    Non-medical: Not on file  Tobacco Use  . Smoking status: Never Smoker  . Smokeless tobacco: Never Used  Substance and Sexual Activity  . Alcohol use: Yes    Frequency: Never    Comment: occasionally  . Drug use: No  . Sexual activity: Not Currently    Birth control/protection: None  Lifestyle  . Physical activity:    Days  per week: Not on file    Minutes per session: Not on file  . Stress: Not on file  Relationships  . Social connections:    Talks on phone: Not on file    Gets together: Not on file    Attends religious service: Not on file    Active member of club or organization: Not on file    Attends meetings of clubs or organizations: Not on file    Relationship status: Not on file  . Intimate partner violence:    Fear of current or ex partner: Not on file    Emotionally abused: Not on file    Physically abused: Not on file    Forced sexual activity: Not on file  Other Topics Concern  . Not on file  Social History Narrative   Lives with daughter   Works as an Secretary/administratorinsurance agent   Baker (makes cakes)   No pets   1 [place]    Past Surgical History:  Procedure Laterality Date  . BUNIONECTOMY Right 2008  . CESAREAN SECTION  1991  . CESAREAN SECTION  1994  . COLONOSCOPY     1 year ago at [facility]. Normal per pt  .  ROTATOR CUFF REPAIR Left 2011    Family History  Problem Relation Age of Onset  . Arthritis Mother   . Heart disease Mother   . Hyperlipidemia Mother   . Hypertension Mother   . Kidney disease Mother   . Stroke Mother   . Heart attack Mother   . Depression Sister   . Drug abuse Sister   . Early death Sister        ?accidental overdose  . Cancer Brother        prostate  . Diabetes Brother   . Asthma Son   . Learning disabilities Son   . Mental illness Son   . Asthma Sister   . Diabetes Sister   . Hypertension Sister   . Drug abuse Brother   . Diabetes Brother   . Heart attack Brother   . Learning disabilities Brother   . Drug abuse Brother   . Colon cancer Maternal Aunt        at age 52     Allergies  Allergen Reactions  . Lisinopril Other (See Comments)    Other reaction(s): COUGH Other reaction(s): COUGH     Current Outpatient Medications on File Prior to Visit  Medication Sig Dispense Refill  . glipiZIDE (GLUCOTROL XL) 2.5 MG 24  hr tablet Take 1 tablet (2.5 mg total) by mouth daily with breakfast. 30 tablet 5  . JANUVIA 50 MG tablet TAKE 1 TABLET(50 MG) BY MOUTH DAILY 30 tablet 2  . olmesartan-hydrochlorothiazide (BENICAR HCT) 20-12.5 MG tablet Take 1 tablet by mouth daily. 30 tablet 2  . pioglitazone (ACTOS) 30 MG tablet Take 1 tablet (30 mg total) by mouth daily. 30 tablet 2  . simvastatin (ZOCOR) 40 MG tablet Take 1 tablet (40 mg total) by mouth at bedtime. 30 tablet 5  . Vitamin D, Ergocalciferol, (DRISDOL) 50000 units CAPS capsule TK 1 C PO 1 TIME A WK  5  . Lancets (FREESTYLE) lancets Use as instructed to check blood sugar once a day.  DX E11.65 (Patient not taking: Reported on 10/09/2018) 100 each 1   No current facility-administered medications on file prior to visit.     BP (!) 112/55 (BP Location: Right Arm, Patient Position: Sitting, Cuff Size: Large)   Pulse 83   Temp 97.8 F (36.6 C) (Oral)   Ht 4\' 11"  (1.499 m)   Wt 239 lb 9.6 oz (108.7 kg)   SpO2 100%   BMI 48.39 kg/m       Objective:   Physical Exam  Constitutional: She is oriented to person, place, and time. She appears well-developed and well-nourished.  Cardiovascular: Normal rate, regular rhythm and normal heart sounds.  No murmur heard. Pulmonary/Chest: Effort normal and breath sounds normal. No respiratory distress. She has no wheezes.  Abdominal: Soft. She exhibits no distension. There is no tenderness.  Neurological: She is alert and oriented to person, place, and time.  Skin: Skin is warm and dry.  Psychiatric: She has a normal mood and affect. Her behavior is normal. Judgment and thought content normal.          Assessment & Plan:  Heme + stool-  Pt given number to call for GI to schedule her appointment. Check follow up cbc. Hemocue reading today is 8.8 POC testing.   Nausea- check abd [place] to assess GB. Check cmet. Offered rx for anti-emetic but she declines. Will rx with omeprazole.  Anemia- uncontrolled.  Refer to  hematology for further evaluation.    HTN-  BP stable on current meds. Continue same. Obtain follow up cmet.   Declines flu shot.

## 2018-10-12 ENCOUNTER — Telehealth: Payer: Self-pay | Admitting: Family

## 2018-10-12 DIAGNOSIS — E1165 Type 2 diabetes mellitus with hyperglycemia: Secondary | ICD-10-CM

## 2018-10-12 NOTE — Telephone Encounter (Signed)
Sugar is improved but still above goal.  I would recommend referral to endo.

## 2018-10-14 NOTE — Telephone Encounter (Signed)
Left detailed message on pt's voicemail and to call if she has not been contacted within 1 week about appt. Or if she has questions.

## 2018-10-18 ENCOUNTER — Telehealth: Payer: Self-pay | Admitting: Family

## 2018-10-18 NOTE — Telephone Encounter (Signed)
lmom for pt to return call to office re new pt appointment 10/30/18 at 1 pm. appt letter mailed as well

## 2018-10-21 ENCOUNTER — Ambulatory Visit: Payer: BLUE CROSS/BLUE SHIELD | Admitting: Family

## 2018-10-29 ENCOUNTER — Telehealth: Payer: Self-pay | Admitting: Family

## 2018-10-29 ENCOUNTER — Other Ambulatory Visit: Payer: Self-pay | Admitting: Family

## 2018-10-29 DIAGNOSIS — D649 Anemia, unspecified: Secondary | ICD-10-CM

## 2018-10-29 NOTE — Telephone Encounter (Signed)
lmom to inform pt of resch new pt appt 12/27 at 1015 am per pt voicemail message

## 2018-10-30 ENCOUNTER — Ambulatory Visit: Payer: BLUE CROSS/BLUE SHIELD | Admitting: Family

## 2018-10-30 ENCOUNTER — Other Ambulatory Visit: Payer: BLUE CROSS/BLUE SHIELD

## 2018-11-05 ENCOUNTER — Ambulatory Visit: Payer: BLUE CROSS/BLUE SHIELD | Admitting: Internal Medicine

## 2018-11-07 ENCOUNTER — Other Ambulatory Visit: Payer: Self-pay

## 2018-11-07 ENCOUNTER — Encounter: Payer: Self-pay | Admitting: Family

## 2018-11-07 ENCOUNTER — Inpatient Hospital Stay: Payer: BLUE CROSS/BLUE SHIELD | Attending: Hematology & Oncology

## 2018-11-07 ENCOUNTER — Inpatient Hospital Stay (HOSPITAL_BASED_OUTPATIENT_CLINIC_OR_DEPARTMENT_OTHER): Payer: BLUE CROSS/BLUE SHIELD | Admitting: Family

## 2018-11-07 VITALS — BP 110/66 | HR 85 | Temp 98.1°F | Resp 16 | Ht 59.0 in | Wt 242.0 lb

## 2018-11-07 DIAGNOSIS — Z8042 Family history of malignant neoplasm of prostate: Secondary | ICD-10-CM

## 2018-11-07 DIAGNOSIS — K922 Gastrointestinal hemorrhage, unspecified: Secondary | ICD-10-CM

## 2018-11-07 DIAGNOSIS — D573 Sickle-cell trait: Secondary | ICD-10-CM | POA: Insufficient documentation

## 2018-11-07 DIAGNOSIS — Z832 Family history of diseases of the blood and blood-forming organs and certain disorders involving the immune mechanism: Secondary | ICD-10-CM

## 2018-11-07 DIAGNOSIS — Z800 Family history of malignant neoplasm of digestive organs: Secondary | ICD-10-CM | POA: Insufficient documentation

## 2018-11-07 DIAGNOSIS — D649 Anemia, unspecified: Secondary | ICD-10-CM

## 2018-11-07 DIAGNOSIS — D500 Iron deficiency anemia secondary to blood loss (chronic): Secondary | ICD-10-CM

## 2018-11-07 HISTORY — DX: Iron deficiency anemia secondary to blood loss (chronic): D50.0

## 2018-11-07 LAB — CMP (CANCER CENTER ONLY)
ALT: 10 U/L (ref 0–44)
AST: 18 U/L (ref 15–41)
Albumin: 3.7 g/dL (ref 3.5–5.0)
Alkaline Phosphatase: 70 U/L (ref 38–126)
Anion gap: 6 (ref 5–15)
BUN: 37 mg/dL — ABNORMAL HIGH (ref 6–20)
CO2: 29 mmol/L (ref 22–32)
Calcium: 8.9 mg/dL (ref 8.9–10.3)
Chloride: 104 mmol/L (ref 98–111)
Creatinine: 1.95 mg/dL — ABNORMAL HIGH (ref 0.44–1.00)
GFR, Est AFR Am: 32 mL/min — ABNORMAL LOW (ref >60)
GFR, Estimated: 28 mL/min — ABNORMAL LOW (ref >60)
GLUCOSE: 189 mg/dL — AB (ref 70–99)
Potassium: 4 mmol/L (ref 3.5–5.1)
Sodium: 139 mmol/L (ref 135–145)
Total Bilirubin: 0.3 mg/dL (ref 0.3–1.2)
Total Protein: 7.5 g/dL (ref 6.5–8.1)

## 2018-11-07 LAB — IRON AND TIBC
IRON: 40 ug/dL — AB (ref 41–142)
Saturation Ratios: 14 % — ABNORMAL LOW (ref 21–57)
TIBC: 285 ug/dL (ref 236–444)
UIBC: 245 ug/dL (ref 120–384)

## 2018-11-07 LAB — CBC WITH DIFFERENTIAL (CANCER CENTER ONLY)
Abs Immature Granulocytes: 0.02 10*3/uL (ref 0.00–0.07)
BASOS ABS: 0 10*3/uL (ref 0.0–0.1)
Basophils Relative: 0 %
EOS PCT: 3 %
Eosinophils Absolute: 0.2 10*3/uL (ref 0.0–0.5)
HCT: 29.6 % — ABNORMAL LOW (ref 36.0–46.0)
Hemoglobin: 9.2 g/dL — ABNORMAL LOW (ref 12.0–15.0)
Immature Granulocytes: 0 %
Lymphocytes Relative: 39 %
Lymphs Abs: 3.4 10*3/uL (ref 0.7–4.0)
MCH: 27.1 pg (ref 26.0–34.0)
MCHC: 31.1 g/dL (ref 30.0–36.0)
MCV: 87.1 fL (ref 80.0–100.0)
Monocytes Absolute: 0.6 10*3/uL (ref 0.1–1.0)
Monocytes Relative: 7 %
NRBC: 0 % (ref 0.0–0.2)
Neutro Abs: 4.5 10*3/uL (ref 1.7–7.7)
Neutrophils Relative %: 51 %
Platelet Count: 253 10*3/uL (ref 150–400)
RBC: 3.4 MIL/uL — ABNORMAL LOW (ref 3.87–5.11)
RDW: 12.7 % (ref 11.5–15.5)
WBC Count: 8.8 10*3/uL (ref 4.0–10.5)

## 2018-11-07 LAB — SAVE SMEAR(SSMR), FOR PROVIDER SLIDE REVIEW

## 2018-11-07 LAB — RETICULOCYTES
Immature Retic Fract: 10.4 % (ref 2.3–15.9)
RBC.: 3.4 MIL/uL — ABNORMAL LOW (ref 3.87–5.11)
Retic Count, Absolute: 54.1 10*3/uL (ref 19.0–186.0)
Retic Ct Pct: 1.6 % (ref 0.4–3.1)

## 2018-11-07 LAB — LACTATE DEHYDROGENASE: LDH: 205 U/L — ABNORMAL HIGH (ref 98–192)

## 2018-11-07 LAB — FERRITIN: Ferritin: 43 ng/mL (ref 11–307)

## 2018-11-07 NOTE — Progress Notes (Addendum)
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 Fredderick ErbSouthern is a very pleasant 57 yo African American female with history of anemia.  She was found to have heme positive stool with her PCP. She states that her recent colonoscopy and endoscopy were normal and that she will be having a capsule endoscopy next week with GI Dr. Conley RollsLe. She is not currently on any iron supplement or folic acid.  She is post menopausal. No other episodes of bleeding noted. No bruising or petechiae.  She is symptomatic with fatigue, weakness, occasional palpitations, SOB with over exertion, chills and numbness and tingling in her hands and feet.  She had heavy cycles as a young girl and had iron deficiency and took an oral iron supplement.   Her daughter has the sickle cell trait and is also anemic. She comes here for IV iron. Her maternal aunt had colon cancer.  She had a paternal uncle that had history of GI bleed, unknown cause.  She has occasional nausea, no vomiting. This has caused a decrease in her appetite. She admits that she needs to better hydrate at home.  She is actively exercising and trying to lose weight. She states that she is down 7 lbs over the last few weeks.  She states that she has a new patient appointment with an endocrinologist next week. He blood sugars are currently poorly controlled. She would really like to eventually control this with healthy eating and exercise.  She denies using NSAIDs.  She will occasionally have an itchy, raised rash on her arms and neck that resolves when she takes benadryl.  She has had some sinus drainage and dry cough.  No fever, dizziness, chest pain,  abdominal pain or changes in bowel or bladder habits.  No swelling or tenderness in her extremities.  No lymphadenopathy noted on exam.  She does not smoke or drink alcoholic beverages.  She states that is scheduled to have her annual mammogram in January.   ROS: All other 10 point review of systems is negative.   PAST MEDICAL HISTORY:   Past Medical History:  Diagnosis Date  . Chicken pox   . Colon polyps   . Diabetes mellitus without complication (HCC)   . Hyperlipidemia   . Hypertension     ALLERGIES: Allergies  Allergen Reactions  . Lisinopril Other (See Comments)    Other reaction(s): COUGH Other reaction(s): COUGH  Other reaction(s): COUGH      MEDICATIONS:  Current Outpatient Medications on File Prior to Visit  Medication Sig Dispense Refill  . glipiZIDE (GLUCOTROL XL) 2.5 MG 24 hr tablet Take 1 tablet (2.5 mg total) by mouth daily with breakfast. 30 tablet 5  . JANUVIA 50 MG tablet TAKE 1 TABLET(50 MG) BY MOUTH DAILY 30 tablet 2  . olmesartan-hydrochlorothiazide (BENICAR HCT) 20-12.5 MG tablet Take 1 tablet by mouth daily. 30 tablet 2  . simvastatin (ZOCOR) 40 MG tablet Take 1 tablet (40 mg total) by mouth at bedtime. 30 tablet 5  . Vitamin D, Ergocalciferol, (DRISDOL) 50000 units CAPS capsule TK 1 C PO 1 TIME A WK  5   No current facility-administered medications on file prior to visit.  PAST SURGICAL HISTORY Past Surgical History:  Procedure Laterality Date  . BUNIONECTOMY Right 2008  . CESAREAN SECTION  1991  . CESAREAN SECTION  1994  . COLONOSCOPY     1 year ago at [facility]. Normal per pt  . ROTATOR CUFF REPAIR Left 2011    FAMILY HISTORY: Family History  Problem Relation Age of Onset  . Arthritis Mother   . Heart disease Mother   . Hyperlipidemia Mother   . Hypertension Mother   . Kidney disease Mother   . Stroke Mother   . Heart attack Mother   . Depression Sister   . Drug abuse Sister   . Early death Sister        ?accidental  overdose  . Cancer Brother        prostate  . Diabetes Brother   . Asthma Son   . Learning disabilities Son   . Mental illness Son   . Asthma Sister   . Diabetes Sister   . Hypertension Sister   . Drug abuse Brother   . Diabetes Brother   . Heart attack Brother   . Learning disabilities Brother   . Drug abuse Brother   . Colon cancer Maternal Aunt        at age 81     SOCIAL HISTORY:  reports that she has never smoked. She has never used smokeless tobacco. She reports current alcohol use. She reports that she does not use drugs.  PERFORMANCE STATUS: The patient's performance status is 1 - Symptomatic but completely ambulatory  PHYSICAL EXAM: Most Recent Vital Signs: Blood pressure 110/66, pulse 85, temperature 98.1 F (36.7 C), temperature source Oral, resp. rate 16, height 4\' 11"  (1.499 m), weight 242 lb (109.8 kg), SpO2 100 %. BP 110/66 (Patient Position: Sitting)   Pulse 85   Temp 98.1 F (36.7 C) (Oral)   Resp 16   Ht 4\' 11"  (1.499 m)   Wt 242 lb (109.8 kg)   SpO2 100%   BMI 48.88 kg/m   General Appearance:    Alert, cooperative, no distress, appears stated age  Head:    Normocephalic, without obvious abnormality, atraumatic  Eyes:    PERRL, conjunctiva/corneas clear, EOM's intact, fundi    benign, both eyes        Throat:   Lips, mucosa, and tongue normal; teeth and gums normal  Neck:   Supple, symmetrical, trachea midline, no adenopathy;    thyroid:  no enlargement/tenderness/nodules; no carotid   bruit or JVD  Back:     Symmetric, no curvature, ROM normal, no CVA tenderness  Lungs:     Clear to auscultation bilaterally, respirations unlabored  Chest Wall:    No tenderness or deformity   Heart:    Regular rate and rhythm, S1 and S2 normal, no murmur, rub   or gallop     Abdomen:     Soft, non-tender, bowel sounds active all four quadrants,    no masses, no organomegaly        Extremities:   Extremities normal, atraumatic, no cyanosis or edema  Pulses:    2+ and symmetric all extremities  Skin:   Skin color, texture, turgor normal, no rashes or lesions  Lymph nodes:   Cervical, supraclavicular, and axillary nodes normal  Neurologic:   CNII-XII intact, normal strength, sensation and reflexes    throughout    LABORATORY DATA:  Results for orders placed or performed in visit on 11/07/18 (from the past 48 hour(s))  CBC with Differential (Cancer Center Only)     Status: Abnormal   Collection Time: 11/07/18 10:07 AM  Result Value Ref Range   WBC Count 8.8 4.0 - 10.5 K/uL   RBC 3.40 (L) 3.87 - 5.11 MIL/uL   Hemoglobin 9.2 (L) 12.0 - 15.0 g/dL   HCT 16.1 (L) 09.6 - 04.5 %   MCV 87.1 80.0 - 100.0 fL   MCH 27.1 26.0 - 34.0 pg   MCHC 31.1 30.0 - 36.0 g/dL   RDW 40.9 81.1 - 91.4 %   Platelet Count 253 150 - 400 K/uL   nRBC 0.0 0.0 - 0.2 %   Neutrophils Relative % 51 %   Neutro Abs 4.5 1.7 - 7.7 K/uL   Lymphocytes Relative 39 %   Lymphs Abs 3.4 0.7 - 4.0 K/uL   Monocytes Relative 7 %   Monocytes Absolute 0.6 0.1 - 1.0 K/uL   Eosinophils Relative 3 %   Eosinophils Absolute 0.2 0.0 - 0.5 K/uL   Basophils Relative 0 %   Basophils Absolute 0.0 0.0 - 0.1 K/uL   Immature Granulocytes 0 %   Abs Immature Granulocytes 0.02 0.00 - 0.07 K/uL    Comment: Performed at [facility] at [facility], [address], [place], [place] [address]  Reticulocytes     Status: Abnormal   Collection Time: 11/07/18 10:07 AM  Result Value Ref Range   Retic Ct Pct 1.6 0.4 - 3.1 %   RBC. 3.40 (L) 3.87 - 5.11 MIL/uL   Retic Count, Absolute 54.1 19.0 - 186.0 K/uL   Immature Retic Fract 10.4 2.3 - 15.9 %    Comment: Performed at [facility] at [facility], [address] [place], [place] [address]  Save Smear [facility])     Status: None   Collection Time: 11/07/18 10:07 AM  Result Value Ref Range   Smear Review SMEAR STAINED AND AVAILABLE FOR REVIEW     Comment: Performed at [facility] at  [facility], [address], [place], [place] [address]      RADIOGRAPHY: No results found.     PATHOLOGY: None  ASSESSMENT/PLAN: Ms. Leng is a very pleasant 57 yo African American female with anemia secondary to GI blood loss. She states that she will be having a capsule endoscopy next week. She is symptomatic as mentioned above with Hgb of 9.2, MCV 87.  We will go ahead and get her start on folic acid 1 mg PO daily.  We will see what all her lab work shows and bring her back for treatment as indicated.   Once we have results we will schedule a follow-up as well.   All questions were answered and she is in agreement with the plan. She will contact our office with any questions or concerns. We can certainly see her sooner if need be.   She was discussed with and also seen by Dr. Myna Hidalgo and he is in agreement with the aforementioned.   Emeline Gins, FNP-BC     Addendum:    I saw and examined Ms. Fitzwater with Maralyn Sago.  I agree with the above assessment.  She clearly has GI blood loss.  She has had heme positive stools.  She is going for a capsule endoscopy next week.  We got her iron studies.  Her ferritin is 43 with iron saturation of 14%.  She clearly needs IV iron.  We will also  put her on folic acid.  She may have sickle cell trait.  I looked at her blood under the microscope.  There were some target cells.  She had some poikilocytosis.  Her white blood cells appeared mature.  Platelets looked okay.  Again, I think that IV iron will clearly help her out.  We will help get this set up for her.  We spent about 40 minutes with she and her daughter.  All the time spent face-to-face.  We answered her questions.  We help to coordinate her iron infusions.  We helped coordinate her future appointments.  We helped counsel her about her situation.  We will plan to have her come back in about a month or so.  Christin BachPete Ennever, MD

## 2018-11-08 ENCOUNTER — Ambulatory Visit (HOSPITAL_BASED_OUTPATIENT_CLINIC_OR_DEPARTMENT_OTHER): Payer: BLUE CROSS/BLUE SHIELD

## 2018-11-08 ENCOUNTER — Other Ambulatory Visit (HOSPITAL_BASED_OUTPATIENT_CLINIC_OR_DEPARTMENT_OTHER): Payer: BLUE CROSS/BLUE SHIELD

## 2018-11-08 LAB — HEMOGLOBINOPATHY EVALUATION
HGB A2 QUANT: 4.3 % — AB (ref 1.8–3.2)
Hgb A: 59.8 % — ABNORMAL LOW (ref 96.4–98.8)
Hgb C: 0 %
Hgb F Quant: 0 % (ref 0.0–2.0)
Hgb S Quant: 35.9 % — ABNORMAL HIGH
Hgb Variant: 0 %

## 2018-11-08 LAB — ERYTHROPOIETIN: Erythropoietin: 18.6 m[IU]/mL — ABNORMAL HIGH (ref 2.6–18.5)

## 2018-11-11 ENCOUNTER — Inpatient Hospital Stay: Payer: BLUE CROSS/BLUE SHIELD

## 2018-11-11 ENCOUNTER — Other Ambulatory Visit: Payer: Self-pay | Admitting: Family

## 2018-11-11 VITALS — BP 102/67 | HR 79 | Temp 97.9°F | Resp 17

## 2018-11-11 DIAGNOSIS — D500 Iron deficiency anemia secondary to blood loss (chronic): Secondary | ICD-10-CM | POA: Diagnosis not present

## 2018-11-11 DIAGNOSIS — D573 Sickle-cell trait: Secondary | ICD-10-CM

## 2018-11-11 MED ORDER — SODIUM CHLORIDE 0.9 % IV SOLN
510.0000 mg | Freq: Once | INTRAVENOUS | Status: AC
  Administered 2018-11-11: 510 mg via INTRAVENOUS
  Filled 2018-11-11: qty 17

## 2018-11-11 MED ORDER — SODIUM CHLORIDE 0.9 % IV SOLN
Freq: Once | INTRAVENOUS | Status: AC
  Administered 2018-11-11: 13:00:00 via INTRAVENOUS
  Filled 2018-11-11: qty 250

## 2018-11-11 MED ORDER — FOLIC ACID 1 MG PO TABS: 1.0000 mg | ORAL_TABLET | Freq: Every day | ORAL | 3 refills | Status: AC

## 2018-11-11 NOTE — Patient Instructions (Signed)

## 2018-11-14 LAB — ALPHA-THALASSEMIA GENOTYPR

## 2018-11-15 ENCOUNTER — Ambulatory Visit: Payer: BLUE CROSS/BLUE SHIELD | Admitting: Internal Medicine

## 2018-12-05 ENCOUNTER — Inpatient Hospital Stay: Payer: Self-pay

## 2018-12-05 ENCOUNTER — Inpatient Hospital Stay: Payer: Self-pay | Admitting: Family

## 2018-12-09 ENCOUNTER — Ambulatory Visit: Payer: Self-pay | Admitting: Family

## 2018-12-09 ENCOUNTER — Other Ambulatory Visit: Payer: Self-pay

## 2019-03-18 ENCOUNTER — Other Ambulatory Visit: Payer: Self-pay | Admitting: Family

## 2019-03-18 NOTE — Telephone Encounter (Signed)
Copied from CRM [id number]. Topic: Quick Communication - Rx Refill/Question >> Mar 18, 2019 12:50 PM Zada Girt, Lumin L wrote: Medication: olmesartan-hydrochlorothiazide (BENICAR HCT) 20-12.5 MG tablet  Has the patient contacted their pharmacy? {yes (Agent: If no, request that the patient contact the pharmacy for the refill.) (Agent: If yes, when and what did the pharmacy advise?)  Preferred Pharmacy (with phone number or street name): [facility] - [place], [place] - [address] [place] [place] [address] Phone: [phone] Fax: [phone]  Agent: Please be advised that RX refills may take up to 3 business days. We ask that you follow-up with your pharmacy.

## 2019-03-19 MED ORDER — OLMESARTAN MEDOXOMIL-HCTZ 20-12.5 MG PO TABS: 1.0000 | ORAL_TABLET | Freq: Every day | ORAL | 2 refills | Status: DC

## 2019-03-19 NOTE — Addendum Note (Signed)
Addended by: Steve Rattler A on: 03/19/2019 08:01 AM   Modules accepted: Orders

## 2019-04-11 ENCOUNTER — Other Ambulatory Visit: Payer: Self-pay | Admitting: Family

## 2019-04-14 ENCOUNTER — Other Ambulatory Visit: Payer: Self-pay | Admitting: Family

## 2019-06-05 ENCOUNTER — Telehealth: Payer: Self-pay | Admitting: Family

## 2019-06-05 ENCOUNTER — Other Ambulatory Visit: Payer: Self-pay

## 2019-06-05 ENCOUNTER — Inpatient Hospital Stay (HOSPITAL_BASED_OUTPATIENT_CLINIC_OR_DEPARTMENT_OTHER): Payer: BLUE CROSS/BLUE SHIELD | Admitting: Family

## 2019-06-05 ENCOUNTER — Inpatient Hospital Stay: Payer: BLUE CROSS/BLUE SHIELD | Attending: Family

## 2019-06-05 ENCOUNTER — Encounter: Payer: Self-pay | Admitting: Family

## 2019-06-05 VITALS — BP 130/68 | HR 86 | Temp 97.0°F | Resp 19 | Ht 59.0 in | Wt 249.0 lb

## 2019-06-05 DIAGNOSIS — K922 Gastrointestinal hemorrhage, unspecified: Secondary | ICD-10-CM

## 2019-06-05 DIAGNOSIS — D573 Sickle-cell trait: Secondary | ICD-10-CM | POA: Diagnosis not present

## 2019-06-05 DIAGNOSIS — D500 Iron deficiency anemia secondary to blood loss (chronic): Secondary | ICD-10-CM

## 2019-06-05 LAB — IRON AND TIBC
Iron: 53 ug/dL (ref 41–142)
Saturation Ratios: 19 % — ABNORMAL LOW (ref 21–57)
TIBC: 271 ug/dL (ref 236–444)
UIBC: 219 ug/dL (ref 120–384)

## 2019-06-05 LAB — CBC WITH DIFFERENTIAL (CANCER CENTER ONLY)
Abs Immature Granulocytes: 0.03 10*3/uL (ref 0.00–0.07)
Basophils Absolute: 0 10*3/uL (ref 0.0–0.1)
Basophils Relative: 1 %
Eosinophils Absolute: 0.3 10*3/uL (ref 0.0–0.5)
Eosinophils Relative: 4 %
HCT: 29.5 % — ABNORMAL LOW (ref 36.0–46.0)
Hemoglobin: 9.6 g/dL — ABNORMAL LOW (ref 12.0–15.0)
Immature Granulocytes: 0 %
Lymphocytes Relative: 35 %
Lymphs Abs: 3.1 10*3/uL (ref 0.7–4.0)
MCH: 28.7 pg (ref 26.0–34.0)
MCHC: 32.5 g/dL (ref 30.0–36.0)
MCV: 88.1 fL (ref 80.0–100.0)
Monocytes Absolute: 0.5 10*3/uL (ref 0.1–1.0)
Monocytes Relative: 6 %
Neutro Abs: 4.6 10*3/uL (ref 1.7–7.7)
Neutrophils Relative %: 54 %
Platelet Count: 249 10*3/uL (ref 150–400)
RBC: 3.35 MIL/uL — ABNORMAL LOW (ref 3.87–5.11)
RDW: 12.9 % (ref 11.5–15.5)
WBC Count: 8.6 10*3/uL (ref 4.0–10.5)
nRBC: 0 % (ref 0.0–0.2)

## 2019-06-05 LAB — CMP (CANCER CENTER ONLY)
ALT: 11 U/L (ref 0–44)
AST: 17 U/L (ref 15–41)
Albumin: 3.7 g/dL (ref 3.5–5.0)
Alkaline Phosphatase: 74 U/L (ref 38–126)
Anion gap: 8 (ref 5–15)
BUN: 27 mg/dL — ABNORMAL HIGH (ref 6–20)
CO2: 28 mmol/L (ref 22–32)
Calcium: 8.4 mg/dL — ABNORMAL LOW (ref 8.9–10.3)
Chloride: 104 mmol/L (ref 98–111)
Creatinine: 1.61 mg/dL — ABNORMAL HIGH (ref 0.44–1.00)
GFR, Est AFR Am: 40 mL/min — ABNORMAL LOW (ref >60)
GFR, Estimated: 35 mL/min — ABNORMAL LOW (ref >60)
Glucose, Bld: 217 mg/dL — ABNORMAL HIGH (ref 70–99)
Potassium: 4.5 mmol/L (ref 3.5–5.1)
Sodium: 140 mmol/L (ref 135–145)
Total Bilirubin: 0.4 mg/dL (ref 0.3–1.2)
Total Protein: 7.1 g/dL (ref 6.5–8.1)

## 2019-06-05 LAB — RETICULOCYTES
Immature Retic Fract: 18.7 % — ABNORMAL HIGH (ref 2.3–15.9)
RBC.: 3.36 MIL/uL — ABNORMAL LOW (ref 3.87–5.11)
Retic Count, Absolute: 88.4 10*3/uL (ref 19.0–186.0)
Retic Ct Pct: 2.6 % (ref 0.4–3.1)

## 2019-06-05 LAB — FERRITIN: Ferritin: 167 ng/mL (ref 11–307)

## 2019-06-05 NOTE — Telephone Encounter (Signed)
Moving to [place] on 7/3. She will contact us when ready to have her record forwarded to new hematologist per 7/23 los

## 2019-06-05 NOTE — Progress Notes (Signed)
Hematology and Oncology Follow Up Visit  Janet KellyMary Rose [id number] 09/04/1961 58 y.o. 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 She is feeling fatigued and has had some lightheadedness. Hgb is 9.6 and MCV 88.  She has not noted any blood loss. No bruising or petechiae.  She is preparing to move to [place] this month with her daughter to be closer to her son and his family. This has kept her quite busy.  No fever, chills, n/v, cough, rash, SOB, chest pain, abdominal pain or changes in bowel or bladder habits.  No swelling, tenderness, numbness or tingling in her extremities.  She has a good appetite and is staying well hydrated. Her weight is stable.   ECOG Performance Status: 1 - Symptomatic but completely ambulatory  Medications:  Allergies as of 06/05/2019      Reactions   Lisinopril Other (See Comments)   Other reaction(s): COUGH Other reaction(s): COUGH Other reaction(s): COUGH      Medication List       Accurate as of June 05, 2019  9:50 AM. If you have any questions, ask your nurse or doctor.        folic acid 1 MG tablet Commonly known as: FOLVITE Take 1 tablet (1 mg total) by mouth daily.   glipiZIDE 2.5 MG 24 hr tablet Commonly known as: GLUCOTROL XL Take 1 tablet (2.5 mg total) by mouth daily with breakfast.   Januvia 50 MG tablet Generic drug: sitaGLIPtin TAKE ONE (1) TABLET BY MOUTH EVERY DAY   olmesartan-hydrochlorothiazide 20-12.5 MG tablet Commonly known as: BENICAR HCT Take 1 tablet by mouth daily.   simvastatin 40 MG tablet Commonly known as: ZOCOR Take 1 tablet (40 mg total) by mouth at bedtime.   Vitamin D (Ergocalciferol) 1.25 MG (50000 UT) Caps capsule Commonly known as: DRISDOL TK 1 C PO 1 TIME A WK       Allergies:  Allergies   Allergen Reactions  . Lisinopril Other (See Comments)    Other reaction(s): COUGH Other reaction(s): COUGH  Other reaction(s): COUGH    Past Medical History, Surgical history, Social history, and Family History were reviewed and updated.  Review of Systems: All other 10 point review of systems is negative.   Physical Exam:  vitals were not taken for this visit.   Wt Readings from Last 3 Encounters:  11/07/18 242 lb (109.8 kg)  10/09/18 239 lb 9.6 oz (108.7 kg)  07/22/18 244 lb 3.2 oz (110.8 kg)    Ocular: Sclerae unicteric, pupils equal, round and reactive to light Ear-nose-throat: Oropharynx clear, dentition fair Lymphatic: No cervical or supraclavicular adenopathy Lungs no rales or rhonchi, good excursion bilaterally Heart regular rate and rhythm, no murmur appreciated Abd soft, nontender, positive bowel sounds, no liver or spleen tip palpated on exam, no fluid wave  MSK no focal spinal tenderness, no joint edema Neuro: non-focal, well-oriented, appropriate affect Breasts: Deferred   Lab Results  Component Value Date   WBC 8.6 06/05/2019   HGB 9.6 (L) 06/05/2019   HCT 29.5 (L) 06/05/2019   MCV 88.1 06/05/2019   PLT 249 06/05/2019   Lab Results  Component Value Date   FERRITIN 43 11/07/2018   IRON 40 (L) 11/07/2018   TIBC 285 11/07/2018   UIBC 245 11/07/2018  IRONPCTSAT 14 (L) 11/07/2018   Lab Results  Component Value Date   RETICCTPCT 2.6 06/05/2019   RBC 3.36 (L) 06/05/2019   RBC 3.35 (L) 06/05/2019   No results found for: KPAFRELGTCHN, LAMBDASER, KAPLAMBRATIO No results found for: IGGSERUM, IGA, IGMSERUM No results found for: Ronnald Ramp, A1GS, A2GS, Violet Baldy, MSPIKE, SPEI   Chemistry      Component Value Date/Time   NA 139 11/07/2018 1007   K 4.0 11/07/2018 1007   CL 104 11/07/2018 1007   CO2 29 11/07/2018 1007   BUN 37 (H) 11/07/2018 1007   CREATININE 1.95 (H) 11/07/2018 1007   CREATININE 1.67 (H) 04/05/2018 1510       Component Value Date/Time   CALCIUM 8.9 11/07/2018 1007   ALKPHOS 70 11/07/2018 1007   AST 18 11/07/2018 1007   ALT 10 11/07/2018 1007   BILITOT 0.3 11/07/2018 1007       Impression and Plan: Janet Rose is a very pleasant 58 yo African American female with history of iron deficiency anemia (GI blood loss) and sickle cell trait. We will see what her iron studies show and bring her back in for infusion if needed.  She will let us know once she gets established with hematology in [place] and we can send her records to them.  She will contact our office with any questions or concerns. We can certainly see her sooner if needed.   Laverna Peace, NP 7/23/20209:50 AM

## 2019-06-06 ENCOUNTER — Telehealth: Payer: Self-pay | Admitting: *Deleted

## 2019-06-06 NOTE — Telephone Encounter (Signed)
Message received from patient requesting call back for lab results from 06/05/19 for herself and her daughter, [name].  Call placed back to patient and lab results given to patient.  Pt requesting that herself and daughter have their iron infusions on the same day d/t they are moving to [place] on 06/13/19.  Message sent to scheduling.

## 2019-06-10 ENCOUNTER — Other Ambulatory Visit: Payer: Self-pay

## 2019-06-10 ENCOUNTER — Inpatient Hospital Stay: Payer: BLUE CROSS/BLUE SHIELD

## 2019-06-10 VITALS — BP 131/75 | HR 76 | Temp 97.8°F | Resp 18

## 2019-06-10 DIAGNOSIS — D500 Iron deficiency anemia secondary to blood loss (chronic): Secondary | ICD-10-CM

## 2019-06-10 DIAGNOSIS — K922 Gastrointestinal hemorrhage, unspecified: Secondary | ICD-10-CM | POA: Diagnosis not present

## 2019-06-10 MED ORDER — SODIUM CHLORIDE 0.9 % IV SOLN
Freq: Once | INTRAVENOUS | Status: AC
  Administered 2019-06-10: 15:00:00 via INTRAVENOUS
  Filled 2019-06-10: qty 250

## 2019-06-10 MED ORDER — SODIUM CHLORIDE 0.9 % IV SOLN
510.0000 mg | Freq: Once | INTRAVENOUS | Status: AC
  Administered 2019-06-10: 510 mg via INTRAVENOUS
  Filled 2019-06-10: qty 510

## 2019-06-10 NOTE — Patient Instructions (Signed)

## 2019-06-11 ENCOUNTER — Ambulatory Visit: Payer: BLUE CROSS/BLUE SHIELD

## 2019-06-13 ENCOUNTER — Ambulatory Visit: Payer: BLUE CROSS/BLUE SHIELD

## 2019-08-14 ENCOUNTER — Other Ambulatory Visit: Payer: Self-pay | Admitting: Family

## 2019-08-15 NOTE — Telephone Encounter (Signed)
30 day supply olmesartan - hctz sent to pharmacy and letter mailed to pt to schedule OV.
